# Patient Record
Sex: Female | Born: 1935 | Race: White | Hispanic: No | Marital: Single
Health system: Southern US, Community
[De-identification: ages and names within clinical notes are randomized; demographics above are authoritative.]

## PROBLEM LIST (undated history)

## (undated) DIAGNOSIS — F0390 Unspecified dementia without behavioral disturbance: Secondary | ICD-10-CM

## (undated) DIAGNOSIS — I10 Essential (primary) hypertension: Secondary | ICD-10-CM

## (undated) DIAGNOSIS — Z950 Presence of cardiac pacemaker: Secondary | ICD-10-CM

## (undated) DIAGNOSIS — I2510 Atherosclerotic heart disease of native coronary artery without angina pectoris: Secondary | ICD-10-CM

## (undated) HISTORY — DX: Presence of cardiac pacemaker: Z95.0

## (undated) HISTORY — DX: Atherosclerotic heart disease of native coronary artery without angina pectoris: I25.10

---

## 2016-08-29 ENCOUNTER — Emergency Department: Payer: Medicare Other

## 2016-08-29 ENCOUNTER — Inpatient Hospital Stay (HOSPITAL_COMMUNITY): Payer: Medicare Other

## 2016-08-29 ENCOUNTER — Inpatient Hospital Stay (HOSPITAL_COMMUNITY)
Admission: AD | Admit: 2016-08-29 | Discharge: 2016-09-05 | DRG: 064 | Disposition: A | Discharge status: Skilled Nursing Facility | Payer: Medicare Other | Source: Other Acute Inpatient Hospital | Attending: Neurology | Admitting: Neurology

## 2016-08-29 ENCOUNTER — Emergency Department
Admission: EM | Admit: 2016-08-29 | Discharge: 2016-08-29 | Payer: Medicare Other | Attending: Student in an Organized Health Care Education/Training Program | Admitting: Student in an Organized Health Care Education/Training Program

## 2016-08-29 DIAGNOSIS — G9340 Encephalopathy, unspecified: Secondary | ICD-10-CM | POA: Diagnosis not present

## 2016-08-29 DIAGNOSIS — I619 Nontraumatic intracerebral hemorrhage, unspecified: Secondary | ICD-10-CM | POA: Diagnosis present

## 2016-08-29 DIAGNOSIS — A419 Sepsis, unspecified organism: Secondary | ICD-10-CM | POA: Diagnosis not present

## 2016-08-29 DIAGNOSIS — D72829 Elevated white blood cell count, unspecified: Secondary | ICD-10-CM | POA: Diagnosis present

## 2016-08-29 DIAGNOSIS — F0281 Dementia in other diseases classified elsewhere with behavioral disturbance: Secondary | ICD-10-CM | POA: Diagnosis not present

## 2016-08-29 DIAGNOSIS — I2510 Atherosclerotic heart disease of native coronary artery without angina pectoris: Secondary | ICD-10-CM | POA: Diagnosis not present

## 2016-08-29 DIAGNOSIS — I10 Essential (primary) hypertension: Secondary | ICD-10-CM | POA: Diagnosis present

## 2016-08-29 DIAGNOSIS — Z79899 Other long term (current) drug therapy: Secondary | ICD-10-CM | POA: Diagnosis not present

## 2016-08-29 DIAGNOSIS — R5383 Other fatigue: Secondary | ICD-10-CM | POA: Diagnosis present

## 2016-08-29 DIAGNOSIS — E854 Organ-limited amyloidosis: Secondary | ICD-10-CM | POA: Diagnosis not present

## 2016-08-29 DIAGNOSIS — I611 Nontraumatic intracerebral hemorrhage in hemisphere, cortical: Principal | ICD-10-CM | POA: Diagnosis present

## 2016-08-29 DIAGNOSIS — G8194 Hemiplegia, unspecified affecting left nondominant side: Secondary | ICD-10-CM

## 2016-08-29 DIAGNOSIS — Z7982 Long term (current) use of aspirin: Secondary | ICD-10-CM | POA: Diagnosis not present

## 2016-08-29 DIAGNOSIS — Z66 Do not resuscitate: Secondary | ICD-10-CM | POA: Diagnosis not present

## 2016-08-29 DIAGNOSIS — H53462 Homonymous bilateral field defects, left side: Secondary | ICD-10-CM | POA: Diagnosis not present

## 2016-08-29 DIAGNOSIS — E876 Hypokalemia: Secondary | ICD-10-CM | POA: Diagnosis not present

## 2016-08-29 DIAGNOSIS — Z993 Dependence on wheelchair: Secondary | ICD-10-CM | POA: Diagnosis not present

## 2016-08-29 DIAGNOSIS — I680 Cerebral amyloid angiopathy: Secondary | ICD-10-CM | POA: Diagnosis present

## 2016-08-29 DIAGNOSIS — F0390 Unspecified dementia without behavioral disturbance: Secondary | ICD-10-CM | POA: Diagnosis not present

## 2016-08-29 DIAGNOSIS — R40241 Glasgow coma scale score 13-15, unspecified time: Secondary | ICD-10-CM | POA: Diagnosis present

## 2016-08-29 DIAGNOSIS — G936 Cerebral edema: Secondary | ICD-10-CM | POA: Diagnosis present

## 2016-08-29 DIAGNOSIS — Z950 Presence of cardiac pacemaker: Secondary | ICD-10-CM | POA: Diagnosis not present

## 2016-08-29 DIAGNOSIS — E785 Hyperlipidemia, unspecified: Secondary | ICD-10-CM | POA: Diagnosis not present

## 2016-08-29 DIAGNOSIS — R4182 Altered mental status, unspecified: Secondary | ICD-10-CM | POA: Diagnosis not present

## 2016-08-29 DIAGNOSIS — I6789 Other cerebrovascular disease: Secondary | ICD-10-CM | POA: Diagnosis not present

## 2016-08-29 DIAGNOSIS — R531 Weakness: Secondary | ICD-10-CM

## 2016-08-29 DIAGNOSIS — G301 Alzheimer's disease with late onset: Secondary | ICD-10-CM | POA: Diagnosis not present

## 2016-08-29 HISTORY — DX: Essential (primary) hypertension: I10

## 2016-08-29 HISTORY — DX: Unspecified dementia, unspecified severity, without behavioral disturbance, psychotic disturbance, mood disturbance, and anxiety: F03.90

## 2016-08-29 LAB — TROPONIN I: Troponin I: 0.05 ng/mL (ref <0.03)

## 2016-08-29 LAB — URINALYSIS, COMPLETE (UACMP) WITH MICROSCOPIC
Bacteria, UA: NONE SEEN
Bilirubin Urine: NEGATIVE
Glucose, UA: NEGATIVE mg/dL
KETONES UR: 20 mg/dL — AB
Leukocytes, UA: NEGATIVE
Nitrite: NEGATIVE
PROTEIN: NEGATIVE mg/dL
Specific Gravity, Urine: 1.021 (ref 1.005–1.030)
pH: 6 (ref 5.0–8.0)

## 2016-08-29 LAB — COMPREHENSIVE METABOLIC PANEL
ALBUMIN: 3.4 g/dL — AB (ref 3.5–5.0)
ALT: 17 U/L (ref 14–54)
AST: 29 U/L (ref 15–41)
Alkaline Phosphatase: 81 U/L (ref 38–126)
Anion gap: 9 (ref 5–15)
BILIRUBIN TOTAL: 1.2 mg/dL (ref 0.3–1.2)
BUN: 24 mg/dL — ABNORMAL HIGH (ref 6–20)
CHLORIDE: 102 mmol/L (ref 101–111)
CO2: 27 mmol/L (ref 22–32)
Calcium: 9.2 mg/dL (ref 8.9–10.3)
Creatinine, Ser: 0.8 mg/dL (ref 0.44–1.00)
GFR calc Af Amer: >60 mL/min (ref >60)
GFR calc non Af Amer: >60 mL/min (ref >60)
GLUCOSE: 160 mg/dL — AB (ref 65–99)
Potassium: 3.5 mmol/L (ref 3.5–5.1)
Sodium: 138 mmol/L (ref 135–145)
TOTAL PROTEIN: 6.7 g/dL (ref 6.5–8.1)

## 2016-08-29 LAB — CBC WITH DIFFERENTIAL/PLATELET
BASOS PCT: 0 %
Basophils Absolute: 0.1 10*3/uL (ref 0–0.1)
Eosinophils Absolute: 0 10*3/uL (ref 0–0.7)
Eosinophils Relative: 0 %
HEMATOCRIT: 43.9 % (ref 35.0–47.0)
Hemoglobin: 14.6 g/dL (ref 12.0–16.0)
Lymphocytes Relative: 9 %
Lymphs Abs: 1.3 10*3/uL (ref 1.0–3.6)
MCH: 29.5 pg (ref 26.0–34.0)
MCHC: 33.3 g/dL (ref 32.0–36.0)
MCV: 88.5 fL (ref 80.0–100.0)
Monocytes Absolute: 1.5 10*3/uL — ABNORMAL HIGH (ref 0.2–0.9)
Monocytes Relative: 10 %
NEUTROS ABS: 12.3 10*3/uL — AB (ref 1.4–6.5)
NEUTROS PCT: 81 %
Platelets: 244 10*3/uL (ref 150–440)
RBC: 4.96 MIL/uL (ref 3.80–5.20)
RDW: 13.8 % (ref 11.5–14.5)
WBC: 15.1 10*3/uL — AB (ref 3.6–11.0)

## 2016-08-29 LAB — LACTIC ACID, PLASMA: LACTIC ACID, VENOUS: 1.8 mmol/L (ref 0.5–1.9)

## 2016-08-29 LAB — MRSA PCR SCREENING: MRSA BY PCR: NEGATIVE

## 2016-08-29 LAB — PROTIME-INR
INR: 1.13
Prothrombin Time: 14.6 seconds (ref 11.4–15.2)

## 2016-08-29 LAB — APTT: APTT: 28 s (ref 24–36)

## 2016-08-29 MED ORDER — SODIUM CHLORIDE 0.9 % IV BOLUS (SEPSIS)
1000.0000 mL | Freq: Once | INTRAVENOUS | Status: AC
  Administered 2016-08-29: 1000 mL via INTRAVENOUS

## 2016-08-29 MED ORDER — ACETAMINOPHEN 160 MG/5ML PO SOLN: 650.0000 mg | ORAL | Status: DC | PRN

## 2016-08-29 MED ORDER — SODIUM CHLORIDE 0.9 % IV SOLN
INTRAVENOUS | Status: DC
  Administered 2016-08-29 – 2016-09-02 (×6): via INTRAVENOUS

## 2016-08-29 MED ORDER — DEXTROSE 5 % IV SOLN
2.0000 g | INTRAVENOUS | Status: AC
  Administered 2016-08-29: 2 g via INTRAVENOUS
  Filled 2016-08-29: qty 2

## 2016-08-29 MED ORDER — ACETAMINOPHEN 325 MG PO TABS
650.0000 mg | ORAL_TABLET | ORAL | Status: DC | PRN
  Administered 2016-08-31 – 2016-09-01 (×3): 650 mg via ORAL
  Filled 2016-08-29 (×3): qty 2

## 2016-08-29 MED ORDER — CHLORHEXIDINE GLUCONATE 0.12 % MT SOLN
15.0000 mL | Freq: Two times a day (BID) | OROMUCOSAL | Status: DC
  Administered 2016-08-29 – 2016-08-30 (×3): 15 mL via OROMUCOSAL
  Filled 2016-08-29 (×2): qty 15

## 2016-08-29 MED ORDER — MORPHINE SULFATE (PF) 2 MG/ML IV SOLN: 1.0000 mg | INTRAVENOUS | Status: DC | PRN

## 2016-08-29 MED ORDER — NICARDIPINE HCL IN NACL 20-0.86 MG/200ML-% IV SOLN
0.0000 mg/h | INTRAVENOUS | Status: DC
  Filled 2016-08-29: qty 200

## 2016-08-29 MED ORDER — SODIUM CHLORIDE 0.9 % IV SOLN
20.0000 ug | Freq: Once | INTRAVENOUS | Status: AC
  Administered 2016-08-29: 20 ug via INTRAVENOUS
  Filled 2016-08-29: qty 5

## 2016-08-29 MED ORDER — SENNOSIDES-DOCUSATE SODIUM 8.6-50 MG PO TABS
1.0000 | ORAL_TABLET | Freq: Two times a day (BID) | ORAL | Status: DC
  Administered 2016-08-30 – 2016-09-03 (×9): 1 via ORAL
  Filled 2016-08-29 (×10): qty 1

## 2016-08-29 MED ORDER — ACETAMINOPHEN 650 MG RE SUPP
650.0000 mg | RECTAL | Status: DC | PRN
  Administered 2016-08-29 – 2016-08-31 (×2): 650 mg via RECTAL
  Filled 2016-08-29 (×2): qty 1

## 2016-08-29 MED ORDER — STROKE: EARLY STAGES OF RECOVERY BOOK
Freq: Once | Status: AC
  Administered 2016-08-29: 21:00:00
  Filled 2016-08-29 (×2): qty 1

## 2016-08-29 MED ORDER — ORAL CARE MOUTH RINSE
15.0000 mL | Freq: Two times a day (BID) | OROMUCOSAL | Status: DC
  Administered 2016-08-30: 15 mL via OROMUCOSAL

## 2016-08-29 MED ORDER — CLEVIDIPINE BUTYRATE 0.5 MG/ML IV EMUL
0.0000 mg/h | INTRAVENOUS | Status: DC
  Administered 2016-08-30: 3 mg/h via INTRAVENOUS
  Administered 2016-08-30: 1 mg/h via INTRAVENOUS
  Administered 2016-08-30: 4 mg/h via INTRAVENOUS
  Administered 2016-08-30: 8 mg/h via INTRAVENOUS
  Administered 2016-08-31: 5 mg/h via INTRAVENOUS
  Administered 2016-08-31: 4 mg/h via INTRAVENOUS
  Administered 2016-08-31: 5 mg/h via INTRAVENOUS
  Administered 2016-08-31: 7 mg/h via INTRAVENOUS
  Administered 2016-08-31: 8 mg/h via INTRAVENOUS
  Administered 2016-09-01: 7 mg/h via INTRAVENOUS
  Administered 2016-09-01: 6 mg/h via INTRAVENOUS
  Administered 2016-09-01: 2 mg/h via INTRAVENOUS
  Administered 2016-09-02: 5 mg/h via INTRAVENOUS
  Administered 2016-09-02: 4 mg/h via INTRAVENOUS
  Filled 2016-08-29 (×14): qty 50

## 2016-08-29 MED ORDER — PANTOPRAZOLE SODIUM 40 MG IV SOLR
40.0000 mg | Freq: Every day | INTRAVENOUS | Status: DC
  Administered 2016-08-29 – 2016-08-30 (×2): 40 mg via INTRAVENOUS
  Filled 2016-08-29 (×2): qty 40

## 2016-08-29 MED ORDER — VANCOMYCIN HCL IN DEXTROSE 1-5 GM/200ML-% IV SOLN
1000.0000 mg | Freq: Once | INTRAVENOUS | Status: AC
  Administered 2016-08-29: 1000 mg via INTRAVENOUS
  Filled 2016-08-29: qty 200

## 2016-08-29 MED ORDER — NICARDIPINE HCL IN NACL 20-0.86 MG/200ML-% IV SOLN
0.0000 mg/h | INTRAVENOUS | Status: DC
Start: 2016-08-29 — End: 2016-08-29
  Administered 2016-08-29: 5 mg/h via INTRAVENOUS
  Administered 2016-08-29 (×2): 7 mg/h via INTRAVENOUS
  Filled 2016-08-29 (×3): qty 200

## 2016-08-29 NOTE — ED Provider Notes (Signed)
[facility] Emergency Department Provider Note    First MD Initiated Contact with Patient 08/29/16 0915     (approximate)  I have reviewed the triage vital signs and the nursing notes.   HISTORY  Chief Complaint Weakness  Level V Caveat:  dementia HPI Evelyn Figueroa is a 82 y.o. female history dementia presents from [place] memory care unit which he complaint of weakness and being found slumped over in her wheelchair this morning. Did have a low-grade fever this morning was given Tylenol. No other information provided. Restart retail and was unable to reach any providers. Patient very drowsy and lethargic. Will open eyes voice. Does appear confused.   Past Medical History:  Diagnosis Date  . Dementia   . Hypertension    No family history on file. History reviewed. No pertinent surgical history. There are no active problems to display for this patient.     Prior to Admission medications   Medication Sig Start Date End Date Taking? Authorizing Provider  acetaminophen (TYLENOL) 500 MG tablet Take 1,000 mg by mouth every 8 (eight) hours as needed for mild pain or moderate pain.   Yes Historical Provider, MD  aspirin EC 81 MG tablet Take 81 mg by mouth daily.   Yes Historical Provider, MD  atorvastatin (LIPITOR) 20 MG tablet Take 20 mg by mouth at bedtime.   Yes Historical Provider, MD  calcium carbonate (TUMS - DOSED IN MG ELEMENTAL CALCIUM) 500 MG chewable tablet Chew 1 tablet by mouth every 3 (three) hours as needed for indigestion or heartburn.   Yes Historical Provider, MD  divalproex (DEPAKOTE) 250 MG DR tablet Take 250 mg by mouth 2 (two) times daily.   Yes Historical Provider, MD  donepezil (ARICEPT) 10 MG tablet Take 10 mg by mouth daily.   Yes Historical Provider, MD  enalapril (VASOTEC) 10 MG tablet Take 10 mg by mouth 2 (two) times daily.   Yes Historical Provider, MD  famotidine (PEPCID) 20 MG tablet Take 20 mg by mouth 2 (two) times daily.    Yes Historical Provider, MD  metoprolol tartrate (LOPRESSOR) 25 MG tablet Take 12.5 mg by mouth 2 (two) times daily.   Yes Historical Provider, MD  Multiple Vitamins-Minerals (MULTIVITAMIN ADULT PO) Take 1 tablet by mouth daily.   Yes Historical Provider, MD  polyethylene glycol (MIRALAX / GLYCOLAX) packet Take 17 g by mouth daily.   Yes Historical Provider, MD    Allergies Augmentin [amoxicillin-pot clavulanate] and Iodine    Social History Social History  Substance Use Topics  . Smoking status: Unknown If Ever Smoked  . Smokeless tobacco: Not on file  . Alcohol use No    Review of Systems Patient denies headaches, rhinorrhea, blurry vision, numbness, shortness of breath, chest pain, edema, cough, abdominal pain, nausea, vomiting, diarrhea, dysuria, fevers, rashes or hallucinations unless otherwise stated above in HPI. ____________________________________________   PHYSICAL EXAM:  VITAL SIGNS: Vitals:   08/29/16 1254 08/29/16 1300  BP: 140/63 (!) 145/55  Pulse: 72 65  Resp: 18 14  Temp:      Constitutional: ill and frail appearing  Eyes: Conjunctivae are normal. PERRL. EOMI. Head: Atraumatic. Nose: No congestion/rhinnorhea. Mouth/Throat: Mucous membranes are moist.  Oropharynx non-erythematous. Neck: No stridor. Painless ROM. No cervical spine tenderness to palpation Hematological/Lymphatic/Immunilogical: No cervical lymphadenopathy. Cardiovascular: Normal rate, regular rhythm. Grossly normal heart sounds.  Good peripheral circulation. Respiratory: Normal respiratory effort.  No retractions. Lungs with coarse bibasilar breathsounds Gastrointestinal: Soft and nontender. No distention. No  abdominal bruits. No CVA tenderness. Musculoskeletal: No lower extremity tenderness nor edema.  No joint effusions. Neurologic:  GcS 13, left sided weakness, no seziure activity, confused, full neuro exam limited due to dementia Skin:  Skin is warm, dry and intact. No rash  noted.   ____________________________________________   LABS (all labs ordered are listed, but only abnormal results are displayed)  Results for orders placed or performed during the hospital encounter of 08/29/16 (from the past 24 hour(s))  CBC with Differential/Platelet     Status: Abnormal   Collection Time: 08/29/16  9:26 AM  Result Value Ref Range   WBC 15.1 (H) 3.6 - 11.0 K/uL   RBC 4.96 3.80 - 5.20 MIL/uL   Hemoglobin 14.6 12.0 - 16.0 g/dL   HCT 60.4 54.0 - 98.1 %   MCV 88.5 80.0 - 100.0 fL   MCH 29.5 26.0 - 34.0 pg   MCHC 33.3 32.0 - 36.0 g/dL   RDW 19.1 47.8 - 29.5 %   Platelets 244 150 - 440 K/uL   Neutrophils Relative % 81 %   Neutro Abs 12.3 (H) 1.4 - 6.5 K/uL   Lymphocytes Relative 9 %   Lymphs Abs 1.3 1.0 - 3.6 K/uL   Monocytes Relative 10 %   Monocytes Absolute 1.5 (H) 0.2 - 0.9 K/uL   Eosinophils Relative 0 %   Eosinophils Absolute 0.0 0 - 0.7 K/uL   Basophils Relative 0 %   Basophils Absolute 0.1 0 - 0.1 K/uL  Comprehensive metabolic panel     Status: Abnormal   Collection Time: 08/29/16  9:26 AM  Result Value Ref Range   Sodium 138 135 - 145 mmol/L   Potassium 3.5 3.5 - 5.1 mmol/L   Chloride 102 101 - 111 mmol/L   CO2 27 22 - 32 mmol/L   Glucose, Bld 160 (H) 65 - 99 mg/dL   BUN 24 (H) 6 - 20 mg/dL   Creatinine, Ser 6.21 0.44 - 1.00 mg/dL   Calcium 9.2 8.9 - 30.8 mg/dL   Total Protein 6.7 6.5 - 8.1 g/dL   Albumin 3.4 (L) 3.5 - 5.0 g/dL   AST 29 15 - 41 U/L   ALT 17 14 - 54 U/L   Alkaline Phosphatase 81 38 - 126 U/L   Total Bilirubin 1.2 0.3 - 1.2 mg/dL   GFR calc non Af Amer >60 >60 mL/min   GFR calc Af Amer >60 >60 mL/min   Anion gap 9 5 - 15  Urinalysis, Complete w Microscopic     Status: Abnormal   Collection Time: 08/29/16  9:26 AM  Result Value Ref Range   Color, Urine YELLOW (A) YELLOW   APPearance CLEAR (A) CLEAR   Specific Gravity, Urine 1.021 1.005 - 1.030   pH 6.0 5.0 - 8.0   Glucose, UA NEGATIVE NEGATIVE mg/dL   Hgb urine dipstick  MODERATE (A) NEGATIVE   Bilirubin Urine NEGATIVE NEGATIVE   Ketones, ur 20 (A) NEGATIVE mg/dL   Protein, ur NEGATIVE NEGATIVE mg/dL   Nitrite NEGATIVE NEGATIVE   Leukocytes, UA NEGATIVE NEGATIVE   RBC / HPF 0-5 0 - 5 RBC/hpf   WBC, UA 0-5 0 - 5 WBC/hpf   Bacteria, UA NONE SEEN NONE SEEN   Squamous Epithelial / LPF 0-5 (A) NONE SEEN   Mucous PRESENT   Troponin I     Status: Abnormal   Collection Time: 08/29/16  9:26 AM  Result Value Ref Range   Troponin I 0.05 (HH) <0.03 ng/mL  Lactic  acid, plasma     Status: None   Collection Time: 08/29/16  9:52 AM  Result Value Ref Range   Lactic Acid, Venous 1.8 0.5 - 1.9 mmol/L   ____________________________________________  EKG My review and personal interpretation at Time: 09:32   Indication: ams  Rate: 60  Rhythm: a paced Axis: left Other: rbbb, no STEMI ____________________________________________  RADIOLOGY  I personally reviewed all radiographic images ordered to evaluate for the above acute complaints and reviewed radiology reports and findings.  These findings were personally discussed with the patient.  Please see medical record for radiology report.  ____________________________________________   PROCEDURES  Procedure(s) performed:  Procedures    Critical Care performed: yes CRITICAL CARE Performed by: Willy Eddy   Total critical care time: 50 minutes  Critical care time was exclusive of separately billable procedures and treating other patients.  Critical care was necessary to treat or prevent imminent or life-threatening deterioration.  Critical care was time spent personally by me on the following activities: development of treatment plan with patient and/or surrogate as well as nursing, discussions with consultants, evaluation of patient's response to treatment, examination of patient, obtaining history from patient or surrogate, ordering and performing treatments and interventions, ordering and review of  laboratory studies, ordering and review of radiographic studies, pulse oximetry and re-evaluation of patient's condition.  ____________________________________________   INITIAL IMPRESSION / ASSESSMENT AND PLAN / ED COURSE  Pertinent labs & imaging results that were available during my care of the patient were reviewed by me and considered in my medical decision making (see chart for details).  DDX: Dehydration, sepsis, pna, uti, hypoglycemia, cva, drug effect,  Evelyn Figueroa is a 82 y.o. who presents to the ED with Concern for UTI given weakness and found slumped over in wheelchair at nursing facility. No reported trauma. Patient does take aspirin. She arrives febrile but otherwise hemodynamically stable.  Patient is confused with history of dementia so uncertain as to whether this is a her baseline. Given her age and history of CVA remains on the differential but also infectious process or metabolic process.  The patient will be placed on continuous pulse oximetry and telemetry for monitoring.  Laboratory evaluation will be sent to evaluate for the above complaints.     Clinical Course as of Aug 30 1318  Fri Aug 29, 2016  1115 Patient was CT imaging suggestive of acute hemorrhagic stroke. Patient still protecting her airway.  States she does have a headache. Patient receiving IV antibiotics for sepsis, but I do believe fever and leukocytosis is likely secondary to stroke. Was able to reach the nursing home who confirms that the patient was just bumped over in her chair and weak starting 2 days ago.  [PR]  1147 I spoke with Carma Lair, the patient's daughter and medical decision maker, who states the patient would never have wanted to be placed on life-support or have any invasive heroic lifesaving interventions. She will focus on quality of life would be agreeable to any sort of IV medications and management short of life-support they could help extend her quality of life.  [PR]  1207   Associate with Dr. Roxy Manns neurology emesis And agrees to accept patient to the neuro ICU for further evaluation and management. Agreed with current management. Agreed with holding Keppra unless patient demonstrates seizure-like activity. Cardene is available as needed for elevated blood pressure but her hemodynamics remain stable at this time. We'll continue to monitor. [PR]    Clinical Course User Index [  PR] Willy EddyPatrick Norwin Aleman, MD     ____________________________________________   FINAL CLINICAL IMPRESSION(S) / ED DIAGNOSES  Final diagnoses:  Hemorrhagic stroke (HCC)  Weakness  Sepsis, due to unspecified organism [facility](HCC)      NEW MEDICATIONS STARTED DURING THIS VISIT:  New Prescriptions   No medications on file     Note:  This document was prepared using Dragon voice recognition software and may include unintentional dictation errors.    Willy EddyPatrick Lilliam Chamblee, MD 08/29/16 1322

## 2016-08-29 NOTE — ED Notes (Signed)
Will hang Cardene as needed per MD. Bp stable at this time.

## 2016-08-29 NOTE — ED Notes (Signed)
MD Roxan HockeyRobinson talked to family to update on pts status.

## 2016-08-29 NOTE — Progress Notes (Signed)
Attempted to call the patient's emergency contact (son Lawanna KobusMike Pautz) at the number listed in her chart [phone]). No answer, went to automated voicemail with generic greeting that did not identify the owner of the voicemail. No message was left as could not confirm identity of recipient. Will need to try again at a later time.

## 2016-08-29 NOTE — ED Triage Notes (Signed)
Pt came to ED via EMS from [facility]. Pt is in memory care unit. Staff noticed pt has been weak past 2 days, found pt slumped over in wheel chair, leaning to right side. Had low grade fever this morning, given 500mg  tylenol. Temperature 99.4 on arrival.

## 2016-08-29 NOTE — H&P (Signed)
Neurology Admission H&P   CC: "I'm cold"  HPI: This is a 81-yo woman with h/o dementia who is admitted in transfer from [facility] for management of an acute ICH. History is obtained from the review of the medical record as well as from d/w transferring ED MD. The patient is unable to provide any meaningful information at this time.   She has a history of dementia, the extent of which is not clear to me at this time. She is a resident at [facility] in the Memory Care Unit. Per ED notes, staff reported that the patient had seemed weak for the past two days. This morning she was found slumped over in her wheelchair, leaning to her right side. They reported that she had a low-grade fever this morning for which she was given 500 mg of Tylenol. She was sent to the ED at [facility] for evaluation. Per ED MD notes, the patient was "very drowsy and lethargic. Will open eyes to voice. Does appear confused." She had an initial GCS of 13 with left sided weakness. Due to initial concerns for possible UTI, she was given vancomycin 1000 mg and cefepime 2 grams. CTH was obtained and showed a large R parietal lobe hemorrhage with intraventricular and subarachnoid extension. She has required intermittent treatment for SBP > 160. ED MD reports that after d/w family they requested that the patient be DNR but that she would want all other interventions short of cardiopulmonary resuscitation.  On arrival to the [facility] Neuro ICU, she remains drowsy but rouses to voice and answers simple questions. She has a left hemiparesis. Encounter is limited by encephalopathy and likely by underlying dementia as well.    PMH: Limited to chart review. Patient is unable to provide, no family present.  Past Medical History:  Diagnosis Date  . Dementia   . Hypertension     PSH: Limited to chart review. Patient is unable to provide, no family present.  No past surgical history on  file.  Family history: Limited to chart review. Patient is unable to provide, no family present.  No family history on file.  Social history: Limited to chart review. Patient is unable to provide, no family present.  Social History   Social History  . Marital status: Single    Spouse name: N/A  . Number of children: N/A  . Years of education: N/A   Occupational History  . Not on file.   Social History Main Topics  . Smoking status: Unknown If Ever Smoked  . Smokeless tobacco: Not on file  . Alcohol use No  . Drug use: Unknown  . Sexual activity: Not on file   Other Topics Concern  . Not on file   Social History Narrative  . No narrative on file    Current outpatient meds: Medications reviewed and reconciled. Limited to chart review. Patient is unable to provide, no family present.  1. Miralax 17 g daily 2. MVI once daily 3. Metoprolol 12.5 mg BID 4. Famotidine 20 mg BID 5. Enalapril 10 mg BID 6. Donepezil 10 mg daily 7. Divalproex 25o mg BID 8. Atorvastatin 20 mg QHS 9. Aspirin 81 mg daily 10 . Tylenol prn   Allergies: Allergies  Allergen Reactions  . Augmentin [Amoxicillin-Pot Clavulanate]   . Iodine     ROS: As per HPI. A full 14-point review of systems was performed and is otherwise notable for being cold. ROS is likely limited by encephalopathy and underlying  dementia.   PE:  BP 126/90   Pulse 60   Temp 97.7 F (36.5 C) (Oral)   Resp 16   SpO2 99%   General: WDWN, lying in ICU bed, no acute distress. She is drowsy and initially asleep but rouses to voice. She will briefly open her eyes and fix on me but quickly closes them again even with persistent stimulation. She will follow some simple commands like wiggling toes and sticking out tongue but this is variable. There is a paucity of spontaneous speech. She is shivering.  HEENT: Normocephalic. Neck supple without LAD. I am unable to visualize her oropharynx as she does not open her mouth wide enough.  Tongue appears moist. Sclerae anicteric. No conjunctival injection.  CV: Regular, no murmur. Carotid pulses full and symmetric, no bruits. Distal pulses 2+ and symmetric.  Lungs: CTAB on anterior exam.  Abdomen: Soft, non-distended, non-tender. Bowel sounds present x4.  Extremities: No C/C/E. Neuro:  CN: Pupils are equal and round. They are symmetrically reactive from 2-->1 mm. Eyes are conjugate when she is at her most alert, dysconjugate when drowsy/asleep. She does not consistently blink to visual threat but this is limited by drowsiness. Corneals are intact, weaker on the L side. She has a mildly asymmetric grimace on the L. Tongue protrudes to midline with weak effort. The remainder of her cranial nerves cannot be assessed as she does not participate with the exam.   Motor: Normal bulk for age. She has increased tone in the L arm and leg. She has paratonia on the R. She moves the R side spontaneously with 5/5 grip. She does not participate with confrontational strength testing. She has intermittent shivering.  Sensation: She grimaces and withdraws to noxious stimuli x4, with less response in the LUE.  DTRs: 2+ on the R, 3+ on the L. Toes downgoing bilaterally. No pathologic reflexes.  Coordination and gait: Deferred as she does not participate with the exam.   Labs:  Lab Results  Component Value Date   WBC 15.1 (H) 08/29/2016   HGB 14.6 08/29/2016   HCT 43.9 08/29/2016   PLT 244 08/29/2016   GLUCOSE 160 (H) 08/29/2016   ALT 17 08/29/2016   AST 29 08/29/2016   NA 138 08/29/2016   K 3.5 08/29/2016   CL 102 08/29/2016   CREATININE 0.80 08/29/2016   BUN 24 (H) 08/29/2016   CO2 27 08/29/2016   Lactate 1.8 Troponin 0.05 UA: moderate Hgb, ketones 20  Imaging:  [facility] and [facility]. This shows a large intraparenchymal hemorrhage involving the R parietal and posterior R temporal lobes. This was measured at 5.4 x 6.4 x 2.0 cm in  size with estimated volume of 34.6 mL. There is a mild degree of surrounding vasogenic edema with mild local mass effect. There is subarachnoid blood overlying the hemorrhage and over the adjacent R parietal lobe with a small amount of blood also noted in the interpeduncular fossa. The hemorrhage extends into the dependent portions of both lateral ventricles. There is moderate diffuse generalized atrophy. Diffuse ventriculomegaly is present which may be more reflective of her underlying dementia rather than an acute obstructive picture. Moderate small vessel ischemic disease is seen in the bihemispheric white matter. An old lacune is noted in the R basal ganglia.   Assessment and Plan:  1. Acute ICH: This is most likely hypertensive in etiology though must also consider amyloid angiopathy given lobar location and underlying dementia. Other potential  considerations include AVM, aneurysm, trauma, sympathomimetic abuse, and complication from anticoagulation or antiplatelet therapy. Coags were note done in ED, will order now. Urine drug screen not done in ED, will order now. Monitor blood pressure, goal SBP 140-160 mmHg. Nicardipine drip ordered for blood pressure control. Avoid antiplatelet agents and NSAIDs for the 1-2 weeks. Avoid therapeutic systemic anticoagulation for the next 3-4 weeks. Repeat CTH at 2100 tonight. Use SCDs for DVT prophylaxis. Avoid fever and hyperglycemia as these are associated with worse neurologic prognosis. Avoid hypotonic IVF to minimize the risk of exacerbating cerebral edema. Frequent neuro checks. Prognosis is poor.   ICH score: 2  2. Left hemiparesis: This is acute, due to ICH. PT/OT/rehab as appropriate depending upon patient course and goals of care.  3. Acute encephalopathy: This is likely due in large part to her large ICH in the setting of dementia. Her dementia places her at increased risk of encephalopathy from all causes and predicts delayed recovery from same. She is  likely to have a new baseline should we survive this event. Labs thus far are unrevealing; optimize metabolic status as needed. Minimize the use of opiates, benzos or any medication with strong anticholinergic properties as much as possible.   4. Dementia: This is chronic. The severity is not clear at this time but ED MD states that family reported she has had a more rapid decline in her level of function over the past few months. Will need to try to get a better sense of her baseline level of function.   The patient would likely benefit from acute rehab services. Recommend consultation of PT/OT/SLP with consideration for PM&R consult as appropriate depending upon clinical progress and therapists' recommendations.   Fall risk: Risk factors for falls include age, need for assistance with ADLs at baseline, use of psychotropic/sedative/hypnotic medications, polypharmacy, baseline cognitive dysfunction, female gender, impaired mobility/gait, and new left hemiparesis. Patient's encephalopathy and dementia preclude education at this time. Strict fall precautions. Limit psychoactive medications and sedating medications.  Delirium risk: Risk factors for delirium include history of dementia/cognitive impairment, age, acute ICH, medications/polypharmacy, impaired ADLs at baseline, and ICU admission. Optimize metabolic status. Treat any infection aggressively. Minimize the use of opiates, benzos or any medication with strong anticholinergic properties as much as possible. Optimize sleep-wake cycles as much as you can by keeping the room bright with activity during the day and dark and quiet at night.   Needs outpatient neurology follow-up?: To be determined.   No family present at the time of my visit. Will try to reach them by phone at number listed in chart.   This patient is critically ill and at significant risk of neurological worsening, death and care requires constant monitoring of vital signs,  hemodynamics,respiratory and cardiac monitoring, neurological assessment, discussion with family, other specialists and medical decision making of high complexity. A total of 70 minutes of critical care time was spent on this case.

## 2016-08-29 NOTE — Progress Notes (Signed)
Order for an MRI, however patient has a pacemaker and radiology has no information about the pacemaker. Unable to complete MRI until further information about pacemaker retrieved from family.  Evelyn Figueroa, [name], RN 10:39 PM

## 2016-08-30 ENCOUNTER — Inpatient Hospital Stay (HOSPITAL_COMMUNITY): Payer: Medicare Other

## 2016-08-30 ENCOUNTER — Encounter (HOSPITAL_COMMUNITY): Payer: Self-pay | Admitting: *Deleted

## 2016-08-30 DIAGNOSIS — Z950 Presence of cardiac pacemaker: Secondary | ICD-10-CM

## 2016-08-30 DIAGNOSIS — D72829 Elevated white blood cell count, unspecified: Secondary | ICD-10-CM

## 2016-08-30 DIAGNOSIS — F0281 Dementia in other diseases classified elsewhere with behavioral disturbance: Secondary | ICD-10-CM

## 2016-08-30 DIAGNOSIS — I10 Essential (primary) hypertension: Secondary | ICD-10-CM

## 2016-08-30 DIAGNOSIS — E785 Hyperlipidemia, unspecified: Secondary | ICD-10-CM

## 2016-08-30 DIAGNOSIS — G301 Alzheimer's disease with late onset: Secondary | ICD-10-CM

## 2016-08-30 LAB — CBC
HCT: 38.3 % (ref 36.0–46.0)
Hemoglobin: 12.8 g/dL (ref 12.0–15.0)
MCH: 29.8 pg (ref 26.0–34.0)
MCHC: 33.4 g/dL (ref 30.0–36.0)
MCV: 89.1 fL (ref 78.0–100.0)
PLATELETS: 207 10*3/uL (ref 150–400)
RBC: 4.3 MIL/uL (ref 3.87–5.11)
RDW: 13.4 % (ref 11.5–15.5)
WBC: 12.3 10*3/uL — AB (ref 4.0–10.5)

## 2016-08-30 LAB — COMPREHENSIVE METABOLIC PANEL
ALT: 15 U/L (ref 14–54)
AST: 23 U/L (ref 15–41)
Albumin: 3 g/dL — ABNORMAL LOW (ref 3.5–5.0)
Alkaline Phosphatase: 66 U/L (ref 38–126)
Anion gap: 11 (ref 5–15)
BUN: 18 mg/dL (ref 6–20)
CHLORIDE: 101 mmol/L (ref 101–111)
CO2: 26 mmol/L (ref 22–32)
CREATININE: 0.68 mg/dL (ref 0.44–1.00)
Calcium: 8.9 mg/dL (ref 8.9–10.3)
GFR calc Af Amer: >60 mL/min (ref >60)
GLUCOSE: 99 mg/dL (ref 65–99)
Potassium: 3 mmol/L — ABNORMAL LOW (ref 3.5–5.1)
Sodium: 138 mmol/L (ref 135–145)
Total Bilirubin: 1.3 mg/dL — ABNORMAL HIGH (ref 0.3–1.2)
Total Protein: 5.6 g/dL — ABNORMAL LOW (ref 6.5–8.1)

## 2016-08-30 LAB — LIPID PANEL
CHOL/HDL RATIO: 3.3 ratio
Cholesterol: 148 mg/dL (ref 0–200)
HDL: 45 mg/dL (ref >40)
LDL CALC: 89 mg/dL (ref 0–99)
Triglycerides: 70 mg/dL (ref <150)
VLDL: 14 mg/dL (ref 0–40)

## 2016-08-30 LAB — VITAMIN B12: Vitamin B-12: 321 pg/mL (ref 180–914)

## 2016-08-30 LAB — TSH: TSH: 0.536 u[IU]/mL (ref 0.350–4.500)

## 2016-08-30 MED ORDER — SODIUM CHLORIDE 0.9 % IV SOLN
30.0000 meq | Freq: Once | INTRAVENOUS | Status: AC
  Administered 2016-08-30: 30 meq via INTRAVENOUS
  Filled 2016-08-30: qty 15

## 2016-08-30 NOTE — Evaluation (Signed)
Physical Therapy Evaluation Patient Details Name: Donald Jacque MRN: [id number] DOB: 05/30/1935 Today's Date: 08/30/2016   History of Present Illness  Pt is a 82 y.o. female with h/o dementia and HTN. Pt presenting with L sided weakness and lethargy. CT on 3/9 showed a large intraparenchymal hematoma within the R posterior temporal and parietal lobes.  Clinical Impression  Patient demonstrates deficits in functional mobility as indicated below. Will need continued skilled PT to address deficits and maximize function. Will trial acute PT. Recommend return to SNF upon acute discharge.    Follow Up Recommendations SNF;Supervision/Assistance - 24 hour    Equipment Recommendations  None recommended by PT    Recommendations for Other Services       Precautions / Restrictions Precautions Precautions: Fall Restrictions Weight Bearing Restrictions: No      Mobility  Bed Mobility Overal bed mobility: Needs Assistance Bed Mobility: Supine to Sit;Sit to Supine     Supine to sit: Max assist;+2 for physical assistance Sit to supine: Max assist;+2 for physical assistance   General bed mobility comments: Assist for LEs and trunk with use of bed pad and helicopter technique. Pt appears fearful of falling, tight grip on bed rail and posterior lean resistive to movement.  Transfers                 General transfer comment: Not assessed  Ambulation/Gait                Stairs            Wheelchair Mobility    Modified Rankin (Stroke Patients Only)       Balance Overall balance assessment: Needs assistance Sitting-balance support: Feet supported;Bilateral upper extremity supported Sitting balance-Leahy Scale: Poor Sitting balance - Comments: Posterior lean in sitting, resistive to anterior lean even with cues and encouragement Postural control: Posterior lean                                   Pertinent Vitals/Pain Pain Assessment: Faces Faces  Pain Scale: Hurts little more Pain Location: stomach Pain Intervention(s): Monitored during session    Home Living Family/patient expects to be discharged to:: Skilled nursing facility                 Additional Comments: [facility]    Prior Function           Comments: Unsure, pt unable to provide information.     Hand Dominance        Extremity/Trunk Assessment   Upper Extremity Assessment LUE Deficits / Details: Does have active movement and grip. Not moving LUE to command. No withdrawl to noxious stimuli LUE Sensation: decreased light touch LUE Coordination: decreased fine motor;decreased gross motor    Lower Extremity Assessment Lower Extremity Assessment: Generalized weakness;RLE deficits/detail;LLE deficits/detail;Difficult to assess due to impaired cognition RLE Deficits / Details: noted AROM bilateral LEs but unable to fully complete range against gravity RLE Coordination: decreased fine motor;decreased gross motor LLE Deficits / Details: noted AROM bilateral LEs but unable to fully complete range against gravity LLE Coordination: decreased fine motor;decreased gross motor    Cervical / Trunk Assessment Cervical / Trunk Assessment: Kyphotic  Communication   Communication: No difficulties (hx of dementia)  Cognition Arousal/Alertness: Lethargic Behavior During Therapy: Flat affect Overall Cognitive Status: No family/caregiver present to determine baseline cognitive functioning  General Comments: Hx of dementia. Pt inconsistently responding appropriately to commands.    General Comments      Exercises     Assessment/Plan    PT Assessment Patient needs continued PT services  PT Problem List Decreased strength;Decreased activity tolerance;Decreased balance;Decreased mobility;Decreased coordination;Decreased cognition;Decreased safety awareness;Decreased knowledge of precautions;Pain       PT Treatment  Interventions DME instruction;Functional mobility training;Therapeutic activities;Therapeutic exercise;Balance training;Cognitive remediation;Patient/family education    PT Goals (Current goals can be found in the Care Plan section)  Acute Rehab PT Goals Patient Stated Goal: none stated PT Goal Formulation: Patient unable to participate in goal setting Time For Goal Achievement: 09/13/16 Potential to Achieve Goals: Poor    Frequency Min 2X/week   Barriers to discharge        Co-evaluation PT/OT/SLP Co-Evaluation/Treatment: Yes Reason for Co-Treatment: Necessary to address cognition/behavior during functional activity PT goals addressed during session: Mobility/safety with mobility         End of Session Equipment Utilized During Treatment: Oxygen Activity Tolerance: Patient limited by fatigue Patient left: in bed;with call bell/phone within reach;with bed alarm set Nurse Communication: Mobility status PT Visit Diagnosis: Other symptoms and signs involving the nervous system (R29.898)         Time: [id number] PT Time Calculation (min) (ACUTE ONLY): 17 min   Charges:   PT Evaluation $PT Eval Moderate Complexity: 1 Procedure     PT G Codes:         Fabio AsaDevon J Sharronda Schweers 08/30/2016, 4:16 PM Charlotte Crumbevon Norvel Wenker, PT DPT  [phone]

## 2016-08-30 NOTE — Progress Notes (Signed)
Patient is unable to have MRI.  She has a pacemaker implanted.  The [employer] generator is MRI conditional but one of the leads is by [employer] and is not compatible.  Please call MRI with any questions.

## 2016-08-30 NOTE — Evaluation (Signed)
Occupational Therapy Evaluation Patient Details Name: Evelyn Figueroa MRN: [id number] DOB: 10-01-1934 Today's Date: 08/30/2016    History of Present Illness Pt is a 82 y.o. female with h/o dementia and HTN. Pt presenting with L sided weakness and lethargy. CT on 3/9 showed a large intraparenchymal hematoma within the R posterior temporal and parietal lobes.   Clinical Impression   Unsure of pts PLOF but she did reside at [facility]. Currently pt max-total assist for ADL and bed mobility. Pt inconsistently responding to one step commands and appropriately answering questions. Pt presenting with L sided visual deficits, decreased ROM in LUE, impaired cognition, and poor sitting balance impacting her independence and safety with ADL and functional mobility. Recommending SNF for follow up to maximize independence and safety with ADL and functional mobility. Pt would benefit from continued skilled OT to address established goals.    Follow Up Recommendations  SNF;Supervision/Assistance - 24 hour    Equipment Recommendations  Other (comment) (TBD)    Recommendations for Other Services       Precautions / Restrictions Precautions Precautions: Fall Restrictions Weight Bearing Restrictions: No      Mobility Bed Mobility Overal bed mobility: Needs Assistance Bed Mobility: Supine to Sit;Sit to Supine     Supine to sit: Max assist;+2 for physical assistance Sit to supine: Max assist;+2 for physical assistance   General bed mobility comments: Assist for LEs and trunk with use of bed pad and helicopter technique. Pt appears fearful of falling, tight grip on bed rail and posterior lean resistive to movement.  Transfers                 General transfer comment: Not assessed    Balance Overall balance assessment: Needs assistance Sitting-balance support: Feet supported;Bilateral upper extremity supported Sitting balance-Leahy Scale: Poor Sitting balance -  Comments: Posterior lean in sitting, resistive to anterior lean even with cues and encouragement Postural control: Posterior lean                                  ADL Overall ADL's : Needs assistance/impaired     Grooming: Maximal assistance;Sitting;Wash/dry face Grooming Details (indicate cue type and reason): hand over hand for washcloth to face                   Toilet Transfer Details (indicate cue type and reason): Max assist to roll for placement of bed pan           General ADL Comments: Overall pt max-total assist for ADL. Pt intermittently following commands with R side of body>L side     Vision   Vision Assessment?: Vision impaired- to be further tested in functional context Additional Comments: Difficult to assess due to impaired cognition. Pt does have R gaze preference but can cross midline and attend to stimulus on L side with cues but unable to sustain.     Perception     Praxis      Pertinent Vitals/Pain Pain Assessment: Faces Faces Pain Scale: Hurts little more Pain Location: stomach Pain Intervention(s): Monitored during session     Hand Dominance     Extremity/Trunk Assessment Upper Extremity Assessment Upper Extremity Assessment: LUE deficits/detail;Difficult to assess due to impaired cognition LUE Deficits / Details: Does have active movement and grip. Not moving LUE to command. No withdrawl to noxious stimuli LUE Sensation: decreased light touch LUE Coordination: decreased fine motor;decreased gross  motor   Lower Extremity Assessment Lower Extremity Assessment: Defer to PT evaluation   Cervical / Trunk Assessment Cervical / Trunk Assessment: Kyphotic   Communication Communication Communication: No difficulties (hx of dementia)   Cognition Arousal/Alertness: Lethargic Behavior During Therapy: Flat affect Overall Cognitive Status: No family/caregiver present to determine baseline cognitive functioning                  General Comments: Hx of dementia. Pt inconsistently responding appropriately to commands.   General Comments       Exercises       Shoulder Instructions      Home Living Family/patient expects to be discharged to:: Skilled nursing facility                                 Additional Comments: [facility]      Prior Functioning/Environment          Comments: Unsure, pt unable to provide information.        OT Problem List: Decreased strength;Decreased range of motion;Impaired balance (sitting and/or standing);Impaired vision/perception;Decreased coordination;Decreased cognition;Decreased safety awareness;Decreased knowledge of use of DME or AE;Impaired sensation;Impaired tone;Impaired UE functional use;Pain      OT Treatment/Interventions: Self-care/ADL training;Therapeutic exercise;Neuromuscular education;Energy conservation;DME and/or AE instruction;Therapeutic activities;Visual/perceptual remediation/compensation;Patient/family education;Balance training    OT Goals(Current goals can be found in the care plan section) Acute Rehab OT Goals Patient Stated Goal: none stated OT Goal Formulation: With patient Time For Goal Achievement: 09/13/16 Potential to Achieve Goals: Fair ADL Goals Pt Will Perform Grooming: with min assist;sitting Pt Will Transfer to Toilet: with min assist;with +2 assist;stand pivot transfer;bedside commode Additional ADL Goal #1: Pt will follow one step command 75% of the time in a minimally distracting environment. Additional ADL Goal #2: Pt will attend to 3/5 items on L side of visual field with min verbal cues.  OT Frequency: Min 2X/week   Barriers to D/C:            Co-evaluation PT/OT/SLP Co-Evaluation/Treatment: Yes Reason for Co-Treatment: Necessary to address cognition/behavior during functional activity;Complexity of the patient's impairments (multi-system involvement);For patient/therapist safety    OT goals addressed during session: Other (comment) (functional mobility)      End of Session Nurse Communication: Mobility status (RN present during session)  Activity Tolerance: Patient tolerated treatment well Patient left: in bed;with call bell/phone within reach;with nursing/sitter in room  OT Visit Diagnosis: Hemiplegia and hemiparesis Hemiplegia - Right/Left: Left Hemiplegia - caused by: Nontraumatic SAH                ADL either performed or assessed with clinical judgement  Time: 2841-32440830-0846 OT Time Calculation (min): 16 min Charges:  OT General Charges $OT Visit: 1 Procedure OT Evaluation $OT Eval Moderate Complexity: 1 Procedure G-Codes:     Vinnie Bobst A. Brett Albinooffey, M.S., OTR/L Pager: [phone]  Gaye AlkenBailey A Johnie Stadel 08/30/2016, 9:31 AM

## 2016-08-30 NOTE — Evaluation (Signed)
Clinical/Bedside Swallow Evaluation Patient Details  Name: Evelyn Figueroa MRN: [id number] Date of Birth: 1935/04/26  Today's Date: 08/30/2016 Time: SLP Start Time (ACUTE ONLY): 0954 SLP Stop Time (ACUTE ONLY): 1024 SLP Time Calculation (min) (ACUTE ONLY): 30 min  Past Medical History:  Past Medical History:  Diagnosis Date  . Dementia   . Hypertension    Past Surgical History: No past surgical history on file. HPI:  Pt is a 82 y.o. female with h/o dementia and HTN. Pt presenting with L sided weakness and lethargy. 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 Pt with intermittent tremors of face and oral musculature which nursing states has been baseline since admission. Pt unable to follow commands consistently for OME. Pt required cueing for improved attention secondary to lethargy. Intermittent anterior spillage at midline evidenced with cup sips in addition to prolonged oral transit with intermittent oral holding of liquid consistencies. Overt s/sx of suspected reduced airway protection evidenced following thin and nectar thick liquid trials includling coughing and throat clearing. Swallow initiation suspected to be delayed through palpation. Honey thick liquids with intermittent wet vocal quality. Puree consistencies without overt s/sx of aspiration. Recommend MBS to objectively evaluate swallow function and honey thick liquids with puree consistencies with medicines crushed in puree until MBS can be completed. ST to continue to monitor.  SLP Visit Diagnosis: Dysphagia, unspecified (R13.10)    Aspiration Risk  Moderate aspiration  risk    Diet Recommendation   Honey thick liquids, puree with plans for MBS   Medication Administration: Crushed with puree    Other  Recommendations Oral Care Recommendations: Oral care BID;Staff/trained caregiver to provide oral care Other Recommendations: Order thickener from pharmacy   Follow up Recommendations Skilled Nursing facility      Frequency and Duration min 2x/week  1 week       Prognosis Prognosis for Safe Diet Advancement: Fair Barriers to Reach Goals: Severity of deficits;Time post onset;Cognitive deficits      Swallow Study   General Date of Onset: 08/29/16 HPI: Pt is a 82 y.o. female with h/o dementia and HTN. Pt presenting with L sided weakness and lethargy. CT on 3/9 showed a large intraparenchymal hematoma within the R posterior temporal and parietal lobes Type of Study: Bedside Swallow Evaluation Previous Swallow Assessment: none on file Diet Prior to this Study: NPO Temperature Spikes Noted: No Respiratory Status: Room air History of Recent Intubation: No Behavior/Cognition: Lethargic/Drowsy;Distractible;Requires cueing Oral Cavity Assessment: Dry Oral Care Completed by SLP: Yes Oral Cavity - Dentition: Missing dentition Vision: Impaired for self-feeding Self-Feeding Abilities: Needs assist Patient Positioning: Upright in bed Baseline Vocal Quality: Low vocal intensity Volitional Cough: Weak Volitional Swallow: Unable to elicit    Oral/Motor/Sensory Function Overall Oral Motor/Sensory Function: Generalized oral weakness   Ice Chips Ice chips: Impaired Presentation: Spoon Oral Phase Impairments: Reduced lingual movement/coordination;Impaired mastication Oral Phase Functional Implications: Prolonged oral transit;Oral holding Pharyngeal Phase Impairments: Suspected delayed Swallow;Multiple swallows;Cough - Delayed   Thin Liquid Thin Liquid: Impaired Presentation: Spoon Oral Phase Impairments: Reduced lingual movement/coordination Oral Phase  Functional Implications: Prolonged oral transit;Oral holding Pharyngeal  Phase Impairments: Suspected delayed Swallow;Multiple swallows;Cough - Delayed;Cough - Immediate    Nectar Thick Nectar Thick Liquid: Impaired  Presentation: Cup;Spoon Oral Phase Impairments: Reduced lingual movement/coordination Oral phase functional implications: Prolonged oral transit;Oral holding Pharyngeal Phase Impairments: Suspected delayed Swallow;Multiple swallows;Cough - Delayed;Cough - Immediate   Honey Thick Honey Thick Liquid: Impaired Presentation: Spoon;Cup Oral Phase Impairments: Reduced lingual movement/coordination Oral Phase Functional Implications: Prolonged oral transit;Oral holding Pharyngeal Phase Impairments: Suspected delayed Swallow;Multiple swallows;Wet Vocal Quality   Puree Puree: Impaired Presentation: Spoon Oral Phase Impairments: Reduced lingual movement/coordination;Impaired mastication Oral Phase Functional Implications: Prolonged oral transit;Oral holding Pharyngeal Phase Impairments: Multiple swallows;Suspected delayed Swallow   Solid   GO   Solid: Impaired Oral Phase Impairments: Reduced lingual movement/coordination;Impaired mastication Oral Phase Functional Implications: Prolonged oral transit;Impaired mastication;Oral residue Pharyngeal Phase Impairments: Multiple swallows;Suspected delayed Swallow       Marcene Duos MA, CCC-SLP Acute Care Speech Language Pathologist    Marcene Duos E 08/30/2016,10:52 AM

## 2016-08-30 NOTE — Progress Notes (Signed)
STROKE TEAM PROGRESS NOTE   HISTORY OF PRESENT ILLNESS (per record) This is a 82-yo woman with h/o dementia who is admitted in transfer from [facility] for management of an acute ICH. History is obtained from the review of the medical record as well as from d/w transferring ED MD. The patient is unable to provide any meaningful information at this time.   She has a history of dementia, the extent of which is not clear to me at this time. She is a resident at [facility] in the Memory Care Unit. Per ED notes, staff reported that the patient had seemed weak for the past two days. This morning she was found slumped over in her wheelchair, leaning to her right side. They reported that she had a low-grade fever this morning for which she was given 500 mg of Tylenol. She was sent to the ED at [facility] for evaluation. Per ED MD notes, the patient was "very drowsy and lethargic. Will open eyes to voice. Does appear confused." She had an initial GCS of 13 with left sided weakness. Due to initial concerns for possible UTI, she was given vancomycin 1000 mg and cefepime 2 grams. CTH was obtained and showed a large R parietal lobe hemorrhage with intraventricular and subarachnoid extension. She has required intermittent treatment for SBP > 160. ED MD reports that after d/w family they requested that the patient be DNR but that she would want all other interventions short of cardiopulmonary resuscitation.  On arrival to the [facility] Neuro ICU, she remains drowsy but rouses to voice and answers simple questions. She has a left hemiparesis. Encounter is limited by encephalopathy and likely by underlying dementia as well.    SUBJECTIVE (INTERVAL HISTORY) Her RN is at the bedside.  She is still drowsy sleepy but able to arouse slightly open eyes. Repeat CT in am showed stable hematoma. She has pacemaker, not able to do MRI. Lives in memory unit due to dementia.     OBJECTIVE Temp:  [97.7 F (36.5 C)-101.3 F (38.5 C)] 99.5 F (37.5 C) (03/10 0351) Pulse Rate:  [37-123] 66 (03/10 0700) Cardiac Rhythm: Normal sinus rhythm;Heart block (03/10 0400) Resp:  [11-27] 12 (03/10 0700) BP: (117-178)/(55-94) 128/57 (03/10 0700) SpO2:  [91 %-100 %] 100 % (03/10 0700) Weight:  [57.6 kg (126 lb 15.8 oz)-60.5 kg (133 lb 6.1 oz)] 57.6 kg (126 lb 15.8 oz) (03/09 2000)  CBC:  Recent Labs Lab 08/29/16 0926 08/30/16 0346  WBC 15.1* 12.3*  NEUTROABS 12.3*  --   HGB 14.6 12.8  HCT 43.9 38.3  MCV 88.5 89.1  PLT 244 207    Basic Metabolic Panel:  Recent Labs Lab 08/29/16 0926 08/30/16 0346  NA 138 138  K 3.5 3.0*  CL 102 101  CO2 27 26  GLUCOSE 160* 99  BUN 24* 18  CREATININE 0.80 0.68  CALCIUM 9.2 8.9    Lipid Panel:    Component Value Date/Time   CHOL 148 08/30/2016 0346   TRIG 70 08/30/2016 0346   HDL 45 08/30/2016 0346   CHOLHDL 3.3 08/30/2016 0346   VLDL 14 08/30/2016 0346   LDLCALC 89 08/30/2016 0346   HgbA1c: No results found for: HGBA1C Urine Drug Screen: No results found for: LABOPIA, COCAINSCRNUR, LABBENZ, AMPHETMU, THCU, LABBARB    IMAGING I have personally reviewed the radiological images below and agree with the radiology interpretations.  Ct Head Wo Contrast 08/29/2016 1. No significant interval change in size of  a large intraparenchymal hematoma within the right posterior temporal and parietal lobes with surrounding edema. Bilateral intraventricular hemorrhages are again noted. There is subarachnoid hemorrhage overlying the right intraparenchymal hematoma which does not appear significantly changed. A small amount of left parietal subarachnoid hemorrhage is also unchanged.  2. The ventricles are enlarged but stable in size compared to prior CT. No significant midline shift.  3. Atrophy and periventricular small vessel ischemic changes.   Ct Head Wo Contrast 08/29/2016 1. Large acute hematoma involving the right parietal  and temporal lobes as described (estimated volume 34.6 ml). This likely represents a hemorrhagic stroke. There is associated subarachnoid and intraventricular hemorrhage.  2. Mild ventricular dilatation is likely related to atrophy. No midline shift.   Dg Chest Portable 1 View 08/29/2016 Left pacer is in place with leads in the right atrium and right ventricle.  No active disease.   TTE pending   PHYSICAL EXAM  Temp:  [97.7 F (36.5 C)-101.3 F (38.5 C)] 98.3 F (36.8 C) (03/10 0800) Pulse Rate:  [37-155] 155 (03/10 0900) Resp:  [11-27] 26 (03/10 0900) BP: (117-163)/(55-94) 138/74 (03/10 0900) SpO2:  [92 %-100 %] 92 % (03/10 0900) Weight:  [126 lb 15.8 oz (57.6 kg)] 126 lb 15.8 oz (57.6 kg) (03/09 2000)  General - Well nourished, well developed, drowsy sleepy.  Ophthalmologic - Fundi not visualized due to noncooperation.  Cardiovascular - Regular rate and rhythm.  Neuro - drowsy sleepy, arousable with brief eye opening, orientated to name only, not answer questions consistently. Able to repeat simple sentences, not cooperative on naming. Able to have simple sentence, but not following commands well. Right gaze preference, barely cross midline, left neglect. No significant facial asymmetry, not follow commands for tongue protrusion. BUE 3/5, RLE 3-/5 on pain and LLE slight withdraw on pain. However, significantly increase muscle tone at LUE and LLE. BUE postural tremor with cog wheeling. DTR 1+ and no babinski. Sensation, coordination and gait not tested.    ASSESSMENT/PLAN Ms. Chizaram Latino is a 82 y.o. female with history of dementia, permanent pacemaker, and hypertension presenting with AMS and weakness. She did not receive IV t-PA due to hemorrhage.  Stroke: moderate to large ICH within the right posterior temporal and parietal lobes ~ 34.6 ml, etiology unclear, HTN vs. CAA.   Resultant  Right gaze preference, left neglect, drowsy  MRI / MRA - PPM manufacturer unknown  CT  head - right posterior temporal and parietal ICH  Repeat CT head - stable hematoma  Carotid Doppler - not indicated  2D Echo - pending  LDL - 89  HgbA1c - pending  VTE prophylaxis - SCDs  Diet NPO time specified  aspirin 81 mg daily prior to admission, now on No antithrombotic  Ongoing aggressive stroke risk factor management  Therapy recommendations: SNF recommended  Disposition: Pending  Dementia  Memory unit NH resident  Question about CAA as the cause of lobar hemorrhage  MRI not able to be done due to pacer  Hypertension  Stable - on Cleviprex  NPO status  Pt wishes said no NG tube  Wean off cleviprex as able  Long-term BP goal normotensive  Hyperlipidemia  Home meds:  Lipitor 20 mg daily not resumed in hospital  LDL 89, goal < 70  resume statin at discharge  Other Stroke Risk Factors  Advanced age  Other Active Problems  Mild leukocytosis - 15.1 -> 12.3 (Tmax 101.3)  Hypokalemia - 3.0 - supplement - recheck  Pacemaker in situ  Limited DNR -  all other interventions short of cardiopulmonary resuscitation.  Hospital day # 1  This patient is critically ill due to large lobar ICH, dementia, HTN and at significant risk of neurological worsening, death form recurrent ICH, cerebral edema, delirium, seizure. This patient's care requires constant monitoring of vital signs, hemodynamics, respiratory and cardiac monitoring, review of multiple databases, neurological assessment, discussion with family, other specialists and medical decision making of high complexity. I spent 40 minutes of neurocritical care time in the care of this patient.  Marvel PlanJindong Hance Caspers, MD PhD Stroke Neurology 08/30/2016 11:27 AM   To contact Stroke Continuity provider, please refer to [email or web address]. After hours, contact General Neurology

## 2016-08-31 ENCOUNTER — Inpatient Hospital Stay (HOSPITAL_COMMUNITY): Payer: Medicare Other

## 2016-08-31 ENCOUNTER — Encounter (HOSPITAL_COMMUNITY): Payer: Self-pay | Admitting: *Deleted

## 2016-08-31 DIAGNOSIS — I6789 Other cerebrovascular disease: Secondary | ICD-10-CM

## 2016-08-31 LAB — COMPREHENSIVE METABOLIC PANEL
ALBUMIN: 3 g/dL — AB (ref 3.5–5.0)
ALK PHOS: 65 U/L (ref 38–126)
ALT: 17 U/L (ref 14–54)
AST: 23 U/L (ref 15–41)
Anion gap: 10 (ref 5–15)
BILIRUBIN TOTAL: 1.3 mg/dL — AB (ref 0.3–1.2)
BUN: 15 mg/dL (ref 6–20)
CALCIUM: 9.2 mg/dL (ref 8.9–10.3)
CO2: 25 mmol/L (ref 22–32)
Chloride: 102 mmol/L (ref 101–111)
Creatinine, Ser: 0.67 mg/dL (ref 0.44–1.00)
GFR calc non Af Amer: >60 mL/min (ref >60)
GLUCOSE: 107 mg/dL — AB (ref 65–99)
POTASSIUM: 3.3 mmol/L — AB (ref 3.5–5.1)
SODIUM: 137 mmol/L (ref 135–145)
TOTAL PROTEIN: 5.9 g/dL — AB (ref 6.5–8.1)

## 2016-08-31 LAB — HEMOGLOBIN A1C
Hgb A1c MFr Bld: 5.6 % (ref 4.8–5.6)
Mean Plasma Glucose: 114 mg/dL

## 2016-08-31 LAB — CBC
HCT: 41 % (ref 36.0–46.0)
HEMOGLOBIN: 13.7 g/dL (ref 12.0–15.0)
MCH: 29.4 pg (ref 26.0–34.0)
MCHC: 33.4 g/dL (ref 30.0–36.0)
MCV: 88 fL (ref 78.0–100.0)
Platelets: 241 10*3/uL (ref 150–400)
RBC: 4.66 MIL/uL (ref 3.87–5.11)
RDW: 13 % (ref 11.5–15.5)
WBC: 12.9 10*3/uL — ABNORMAL HIGH (ref 4.0–10.5)

## 2016-08-31 LAB — ECHOCARDIOGRAM COMPLETE
Height: 64 in
WEIGHTICAEL: 2031.76 [oz_av]

## 2016-08-31 MED ORDER — METOPROLOL TARTRATE 25 MG PO TABS
25.0000 mg | ORAL_TABLET | Freq: Two times a day (BID) | ORAL | Status: DC
  Administered 2016-09-01: 25 mg via ORAL
  Filled 2016-08-31: qty 1

## 2016-08-31 MED ORDER — ORAL CARE MOUTH RINSE
15.0000 mL | Freq: Two times a day (BID) | OROMUCOSAL | Status: DC
  Administered 2016-08-31 – 2016-09-05 (×6): 15 mL via OROMUCOSAL

## 2016-08-31 MED ORDER — DONEPEZIL HCL 10 MG PO TABS
10.0000 mg | ORAL_TABLET | Freq: Every day | ORAL | Status: DC
  Administered 2016-08-31 – 2016-09-05 (×6): 10 mg via ORAL
  Filled 2016-08-31 (×6): qty 1

## 2016-08-31 MED ORDER — FAMOTIDINE 20 MG PO TABS
20.0000 mg | ORAL_TABLET | Freq: Every day | ORAL | Status: DC
  Administered 2016-08-31 – 2016-09-05 (×6): 20 mg via ORAL
  Filled 2016-08-31 (×6): qty 1

## 2016-08-31 MED ORDER — FAMOTIDINE 20 MG PO TABS: 20.0000 mg | ORAL_TABLET | Freq: Two times a day (BID) | ORAL | Status: DC

## 2016-08-31 MED ORDER — DIVALPROEX SODIUM 250 MG PO DR TAB
250.0000 mg | DELAYED_RELEASE_TABLET | Freq: Two times a day (BID) | ORAL | Status: DC
  Administered 2016-08-31 – 2016-09-05 (×11): 250 mg via ORAL
  Filled 2016-08-31 (×11): qty 1

## 2016-08-31 MED ORDER — METOPROLOL TARTRATE 25 MG PO TABS
12.5000 mg | ORAL_TABLET | Freq: Once | ORAL | Status: AC
  Administered 2016-08-31: 12.5 mg via ORAL
  Filled 2016-08-31: qty 1

## 2016-08-31 MED ORDER — METOPROLOL TARTRATE 25 MG PO TABS
12.5000 mg | ORAL_TABLET | Freq: Two times a day (BID) | ORAL | Status: DC
  Administered 2016-08-31 (×2): 12.5 mg via ORAL
  Filled 2016-08-31 (×2): qty 1

## 2016-08-31 MED ORDER — ENALAPRIL MALEATE 10 MG PO TABS
10.0000 mg | ORAL_TABLET | Freq: Two times a day (BID) | ORAL | Status: DC
  Administered 2016-08-31 (×2): 10 mg via ORAL
  Filled 2016-08-31 (×3): qty 1

## 2016-08-31 NOTE — Progress Notes (Signed)
Modified Barium Swallow Progress Note  Patient Details  Name: Evelyn Figueroa MRN: [id number] Date of Birth: 03-04-1935  Today's Date: 08/31/2016  Modified Barium Swallow completed.  Full report located under Chart Review in the Imaging Section.  Brief recommendations include the following:  Clinical Impression  Patient presents with moderate oral and mild pharyngeal dysphagia in the setting of large intraparenchymal hematoma within the R posterior temporal and parietal lobeswith prior history of dementia.  Patient is alert but confused, with reduced ability to follow verbal commands. Deficits noted are primarily sensory and oral; reduced awarenes of bolus. Patient with moderate aspiration x2 of thin liquids with large straw bolus and following mechanical solid with oral residue. Oral phase noted for bolus holding, lingual pumping, delayed oral transit. Observed reduced oral manipulation, bolus cohesion of mechanical soft solids, leading to base of tongue and palatal residue. This appears to decrease sensory perception for subsequent thin liquid wash, resulting in prolonged oral holding, delayed swallow initiation with moderate aspiration during the swallow. Swallow initiation is primarily timely at the base of tongue/valleculae with bolus delivery via teaspoon. Mildly reduced base of tongue retraction with mild BOT residue of pureed solids. Epiglottic deflection complete, airway protection adequate. With teaspoons of thin liquid, some intermittent, mild premature spillage to pyriform sinuses due to oral holding/sensory deficits, however most of bolus retained in valleculae prior to swallow initation, with adequate airway protection. In general, oral holding is reduced with smaller volumes and with sensory feedback from teaspoon. Patient benefits from verbal cues and occasional tactile cuing with dry spoon for swallow initiation. With straw and cup sips, patient has poor awareness of bolus and takes in  large volume, resulting in prolonged oral holding, significant premature spillage to pyriform sinuses which results in sensed aspiration during and of pyriform residue after the swallow. Given oral deficits, recommend dys 1 diet with thin liquids via teaspoon only with full supervision and cuing to reduce oral holding. Check oral cavity for residue and cue with dry spoon for additional swallow. Monitor for fatigue, level of alertness. SLP will f/u for tolerance, caregiver education, training in compensatory techniques and feeding strategies, and repeat instrumental as needed.   Swallow Evaluation Recommendations       SLP Diet Recommendations: Thin liquid;Dysphagia 1 (Puree) solids   Liquid Administration via: Spoon;No straw   Medication Administration: Crushed with puree   Supervision: Full assist for feeding   Compensations: Slow rate;Small sips/bites;Other (Comment) (dry spoon to cue swallow)   Postural Changes: Seated upright at 90 degrees   Oral Care Recommendations: Oral care BID     Rondel BatonMary Beth Sebastiana Wuest, [place] CF-SLP Speech-Language Pathologist [phone]    Arlana LindauMary E Dorris Vangorder 08/31/2016,11:11 AM

## 2016-08-31 NOTE — Progress Notes (Signed)
*  PRELIMINARY RESULTS* Echocardiogram 2D Echocardiogram has been performed.  Jeryl Columbialliott, Jadriel Saxer 08/31/2016, 11:18 AM

## 2016-08-31 NOTE — Progress Notes (Signed)
  Speech Language Pathology Treatment: Dysphagia  Patient Details Name: Evelyn Figueroa MRN: [id number] DOB: [birth year] Today's Date: 08/31/2016 Time: 1000-1015 SLP Time Calculation (min) (ACUTE ONLY): 15 min  Assessment / Plan / Recommendation Clinical Impression  Patient seen for dysphagia treatment. RN and daughter present in room; provided education regarding results of MBS and swallowing precautions, demonstrated compensatory techniques and feeding strategies including dry teaspoon and verbal cue for swallow initiation, monitoring for oral holding. Observed RN student feeding patient utilizing the recommended techniques; patient tolerates without overt signs of aspiration and min oral holding. Recommend dys 1 with thin liquids via teaspoon only, medications crushed in puree. SLP will f/u for tolerance, use of strategies, upgrade/repeat instrumental as appropriate.     HPI HPI: Pt is a 82 y.o. female with h/o dementia and HTN. Pt presenting with L sided weakness and lethargy. CT on 3/9 showed a large intraparenchymal hematoma within the R posterior temporal and parietal lobes      SLP Plan  Continue with current plan of care       Recommendations  Diet recommendations: Dysphagia 1 (puree);Thin liquid Liquids provided via: Teaspoon;No straw Medication Administration: Crushed with puree Supervision: Trained caregiver to feed patient Compensations: Slow rate;Small sips/bites;Other (Comment) (dry spoon, verbal cue for swallow initiation) Postural Changes and/or Swallow Maneuvers: Seated upright 90 degrees                Oral Care Recommendations: Staff/trained caregiver to provide oral care;Oral care BID Follow up Recommendations: Skilled Nursing facility SLP Visit Diagnosis: Dysphagia, oropharyngeal phase (R13.12) Plan: Continue with current plan of care       GO               [name], MS CF-SLP Speech-Language Pathologist [phone]  Arlana LindauMary E Dany Figueroa 08/31/2016,  11:15 AM

## 2016-08-31 NOTE — Progress Notes (Signed)
STROKE TEAM PROGRESS NOTE   SUBJECTIVE (INTERVAL HISTORY) Her daughter is at the bedside.  She is more awake alert, passed swallow last night. Repeat CT yesterday no change. Daughter confimed pt DNR status but still want aggressive care at this time. Will consider further code status if pt declines.     OBJECTIVE Temp:  [98.8 F (37.1 C)-99.7 F (37.6 C)] 99.6 F (37.6 C) (03/11 1149) Pulse Rate:  [25-105] 82 (03/11 1200) Cardiac Rhythm: Normal sinus rhythm;Heart block (03/11 0000) Resp:  [12-33] 18 (03/11 1200) BP: (83-168)/(54-130) 151/75 (03/11 1200) SpO2:  [93 %-100 %] 100 % (03/11 1200)  CBC:   Recent Labs Lab 08/29/16 0926 08/30/16 0346 08/31/16 0448  WBC 15.1* 12.3* 12.9*  NEUTROABS 12.3*  --   --   HGB 14.6 12.8 13.7  HCT 43.9 38.3 41.0  MCV 88.5 89.1 88.0  PLT 244 207 241    Basic Metabolic Panel:   Recent Labs Lab 08/30/16 0346 08/31/16 0448  NA 138 137  K 3.0* 3.3*  CL 101 102  CO2 26 25  GLUCOSE 99 107*  BUN 18 15  CREATININE 0.68 0.67  CALCIUM 8.9 9.2    Lipid Panel:     Component Value Date/Time   CHOL 148 08/30/2016 0346   TRIG 70 08/30/2016 0346   HDL 45 08/30/2016 0346   CHOLHDL 3.3 08/30/2016 0346   VLDL 14 08/30/2016 0346   LDLCALC 89 08/30/2016 0346   HgbA1c: No results found for: HGBA1C Urine Drug Screen: No results found for: LABOPIA, COCAINSCRNUR, LABBENZ, AMPHETMU, THCU, LABBARB    IMAGING I have personally reviewed the radiological images below and agree with the radiology interpretations.  Ct Head Wo Contrast 08/29/2016 1. No significant interval change in size of a large intraparenchymal hematoma within the right posterior temporal and parietal lobes with surrounding edema. Bilateral intraventricular hemorrhages are again noted. There is subarachnoid hemorrhage overlying the right intraparenchymal hematoma which does not appear significantly changed. A small amount of left parietal subarachnoid hemorrhage is also unchanged.   2. The ventricles are enlarged but stable in size compared to prior CT. No significant midline shift.  3. Atrophy and periventricular small vessel ischemic changes.   Ct Head Wo Contrast 08/29/2016 1. Large acute hematoma involving the right parietal and temporal lobes as described (estimated volume 34.6 ml). This likely represents a hemorrhagic stroke. There is associated subarachnoid and intraventricular hemorrhage.  2. Mild ventricular dilatation is likely related to atrophy. No midline shift.   Dg Chest Portable 1 View 08/29/2016 Left pacer is in place with leads in the right atrium and right ventricle.  No active disease.   Ct Head Wo Contrast 08/30/2016 IMPRESSION: 1. Expected evolution of large right parietal, temporal, and occipital lobe hemorrhage with extension into the right lateral ventricle. 2. Stable subarachnoid hemorrhage on the right. 3. Stable intraventricular hemorrhage without significant change in ventricular caliber. 4. Stable effacement of the right lateral ventricle and minimal midline shift.    TTE pending   PHYSICAL EXAM  Temp:  [98.8 F (37.1 C)-99.7 F (37.6 C)] 99.6 F (37.6 C) (03/11 1149) Pulse Rate:  [25-105] 82 (03/11 1200) Resp:  [12-33] 18 (03/11 1200) BP: (83-168)/(54-130) 151/75 (03/11 1200) SpO2:  [93 %-100 %] 100 % (03/11 1200)  General - Well nourished, well developed, not in acute distress.  Ophthalmologic - Fundi not visualized due to noncooperation.  Cardiovascular - Regular rate and rhythm.  Neuro - awake, alert, orientated to name only, not orientated to place  time or people. Able to repeat simple sentences, not able to name. Able to have simple sentence, but not following commands well. Eyes attending to both sides, no gaze deviation. However, seems to have left hemianopia. No significant facial asymmetry, not follow commands for tongue protrusion. BUE 3/5 and BLE 3-/5 but lack of effort, no lateralizing weakness. BUE postural tremor  with cog wheeling. DTR 1+ and no babinski. Sensation, coordination and gait not tested.    ASSESSMENT/PLAN Ms. Evelyn Figueroa is a 82 y.o. female with history of dementia, permanent pacemaker, and hypertension presenting with AMS and weakness. She did not receive IV t-PA due to hemorrhage.  Stroke: moderate to large ICH within the right posterior temporal and parietal lobes ~ 34.6 ml, etiology unclear, HTN vs. CAA.   Resultant left hemianopia  MRI / MRA - PPM manufacturer unknown  CT head - right posterior temporal and parietal ICH  Repeat CT head x 2 - stable hematoma  Carotid Doppler - not indicated  2D Echo - pending  LDL - 89  HgbA1c - pending  VTE prophylaxis - SCDs DIET - DYS 1 Room service appropriate? Yes; Fluid consistency: Honey Thick  aspirin 81 mg daily prior to admission, now on No antithrombotic  Ongoing aggressive stroke risk factor management  Therapy recommendations: SNF recommended  Disposition: Pending  Dementia  Memory unit NH resident  Question about CAA as the cause of lobar hemorrhage  MRI not able to be done due to pacer  Daughter confirmed pt in SNF wheelchair bound  Resume aricept  Hypertension  Stable - on Cleviprex  Resume home BP meds - enalapril, and metoprolol  Wean off cleviprex as able  Long-term BP goal normotensive  Hyperlipidemia  Home meds:  Lipitor 20 mg daily not resumed in hospital  LDL 89, goal < 70  resume statin at discharge  Other Stroke Risk Factors  Advanced age  Other Active Problems  Mild leukocytosis - 15.1 -> 12.3 -> 12.9  Hypokalemia - 3.3 - supplement - recheck  Pacemaker in situ  Limited DNR - all other interventions short of cardiopulmonary resuscitation.  Hospital day # 2  This patient is critically ill due to large lobar ICH, dementia, HTN and at significant risk of neurological worsening, death form recurrent ICH, cerebral edema, delirium, seizure. This patient's care requires  constant monitoring of vital signs, hemodynamics, respiratory and cardiac monitoring, review of multiple databases, neurological assessment, discussion with family, other specialists and medical decision making of high complexity. I spent 40 minutes of neurocritical care time in the care of this patient. I had long discussion with daughter at bedside, updated her current condition, treatment options, code status and prognosis.   Marvel PlanJindong Cartina Brousseau, MD PhD Stroke Neurology 08/31/2016 12:25 PM   To contact Stroke Continuity provider, please refer to [email or web address]. After hours, contact General Neurology

## 2016-09-01 ENCOUNTER — Inpatient Hospital Stay (HOSPITAL_COMMUNITY): Payer: Medicare Other

## 2016-09-01 DIAGNOSIS — R4182 Altered mental status, unspecified: Secondary | ICD-10-CM

## 2016-09-01 LAB — COMPREHENSIVE METABOLIC PANEL
ALT: 17 U/L (ref 14–54)
ANION GAP: 12 (ref 5–15)
AST: 21 U/L (ref 15–41)
Albumin: 2.9 g/dL — ABNORMAL LOW (ref 3.5–5.0)
Alkaline Phosphatase: 65 U/L (ref 38–126)
BILIRUBIN TOTAL: 0.8 mg/dL (ref 0.3–1.2)
BUN: 15 mg/dL (ref 6–20)
CHLORIDE: 100 mmol/L — AB (ref 101–111)
CO2: 25 mmol/L (ref 22–32)
Calcium: 9.1 mg/dL (ref 8.9–10.3)
Creatinine, Ser: 0.58 mg/dL (ref 0.44–1.00)
Glucose, Bld: 104 mg/dL — ABNORMAL HIGH (ref 65–99)
POTASSIUM: 3 mmol/L — AB (ref 3.5–5.1)
Sodium: 137 mmol/L (ref 135–145)
TOTAL PROTEIN: 5.6 g/dL — AB (ref 6.5–8.1)

## 2016-09-01 LAB — CBC
HCT: 40.3 % (ref 36.0–46.0)
Hemoglobin: 13.6 g/dL (ref 12.0–15.0)
MCH: 29.7 pg (ref 26.0–34.0)
MCHC: 33.7 g/dL (ref 30.0–36.0)
MCV: 88 fL (ref 78.0–100.0)
PLATELETS: 250 10*3/uL (ref 150–400)
RBC: 4.58 MIL/uL (ref 3.87–5.11)
RDW: 12.9 % (ref 11.5–15.5)
WBC: 13.7 10*3/uL — AB (ref 4.0–10.5)

## 2016-09-01 MED ORDER — METOPROLOL TARTRATE 50 MG PO TABS
50.0000 mg | ORAL_TABLET | Freq: Two times a day (BID) | ORAL | Status: DC
  Administered 2016-09-01 – 2016-09-05 (×8): 50 mg via ORAL
  Filled 2016-09-01 (×8): qty 1

## 2016-09-01 MED ORDER — CHLORHEXIDINE GLUCONATE 0.12 % MT SOLN
OROMUCOSAL | Status: AC
  Administered 2016-09-01: 15 mL
  Filled 2016-09-01: qty 15

## 2016-09-01 MED ORDER — POTASSIUM CHLORIDE CRYS ER 20 MEQ PO TBCR
40.0000 meq | EXTENDED_RELEASE_TABLET | ORAL | Status: AC
  Administered 2016-09-01 (×2): 40 meq via ORAL
  Filled 2016-09-01 (×2): qty 2

## 2016-09-01 MED ORDER — ENALAPRIL MALEATE 5 MG PO TABS
20.0000 mg | ORAL_TABLET | Freq: Two times a day (BID) | ORAL | Status: DC
  Administered 2016-09-01 – 2016-09-05 (×9): 20 mg via ORAL
  Filled 2016-09-01 (×5): qty 4
  Filled 2016-09-01 (×3): qty 1
  Filled 2016-09-01 (×2): qty 4

## 2016-09-01 NOTE — Progress Notes (Signed)
  Speech Language Pathology Treatment: Dysphagia  Patient Details Name: Evelyn Figueroa MRN: [id number] DOB: 07-24-1934 Today's Date: 09/01/2016 Time: [id number] SLP Time Calculation (min) (ACUTE ONLY): 10 min  Assessment / Plan / Recommendation Clinical Impression  Pt is lethargic this morning, but with Mod cues for sustained alertness she consumed thin liquids by spoon with one immediate cough observed across all trials. Intermittent oral holding was observed, which was facilitated by verbal command to swallow. Questionable small amount of pureed material observed in her mouth upon SLP arrival, but pt did appear to spontaneously clear this. Recommend to continue with Dys 1 diet and thin liquids by spoon with full supervision. Will continue to follow.   HPI HPI: Pt is a 82 y.o. female with h/o dementia and HTN. Pt presenting with L sided weakness and lethargy. CT on 3/9 showed a large intraparenchymal hematoma within the R posterior temporal and parietal lobes      SLP Plan  Continue with current plan of care       Recommendations  Diet recommendations: Dysphagia 1 (puree);Thin liquid Liquids provided via: Teaspoon Medication Administration: Crushed with puree Supervision: Full supervision/cueing for compensatory strategies;Trained caregiver to feed patient Compensations: Slow rate;Small sips/bites;Other (Comment) (cue to swallow PRN) Postural Changes and/or Swallow Maneuvers: Seated upright 90 degrees                Oral Care Recommendations: Staff/trained caregiver to provide oral care;Oral care BID Follow up Recommendations: Skilled Nursing facility SLP Visit Diagnosis: Dysphagia, oropharyngeal phase (R13.12) Plan: Continue with current plan of care       GO                Evelyn Figueroa, Evelyn Figueroa 09/01/2016, 11:56 AM  Evelyn HamLaura Figueroa, M.A. CCC-SLP [phone]

## 2016-09-01 NOTE — Evaluation (Signed)
Speech Language Pathology Evaluation Patient Details Name: Evelyn Figueroa MRN: [id number] DOB: May 16, 1935 Today's Date: 09/01/2016 Time: [id number] SLP Time Calculation (min) (ACUTE ONLY): 19 min  Problem List:  Patient Active Problem List   Diagnosis Date Noted  . ICH (intracerebral hemorrhage) (HCC) 08/29/2016   Past Medical History:  Past Medical History:  Diagnosis Date  . CAD (coronary artery disease)   . Dementia   . Hypertension   . Pacemaker    Past Surgical History: No past surgical history on file. HPI:  Pt is a 82 y.o. female with h/o dementia and HTN. Pt presenting with L sided weakness and lethargy. CT on 3/9 showed a large intraparenchymal hematoma within the R posterior temporal and parietal lobes   Assessment / Plan / Recommendation Clinical Impression  Pt has memory deficits at baseline but is typically very communicative and interactive, participating in many acitivities within her memory care unit. Today she is lethargic, requiring Mod cues to for brief periods of sustained alertness and attention. She states her own name and identifies her daughter, but does not answer additional biographical questions. She will occasionally follow one-step commands with Max cues provided. Her speech is dysarthric at the word to short phrase level due to weak phonation and imprecise articulation, seemingly without pt awareness. Pt will benefit from SLP f/u to maximize functional communication and cognition.    SLP Assessment  SLP Recommendation/Assessment: Patient needs continued Speech Lanaguage Pathology Services SLP Visit Diagnosis: Dysarthria and anarthria (R47.1);Attention and concentration deficit Attention and concentration deficit following: Nontraumatic intracerebral hemorrhage    Follow Up Recommendations  Skilled Nursing facility    Frequency and Duration min 2x/week  2 weeks      SLP Evaluation Cognition  Overall Cognitive Status: Impaired/Different from  baseline Arousal/Alertness: Lethargic Orientation Level: Oriented to person Attention: Sustained Sustained Attention: Impaired Sustained Attention Impairment: Verbal basic;Functional basic Awareness: Impaired Awareness Impairment: Emergent impairment       Comprehension  Auditory Comprehension Overall Auditory Comprehension: Impaired Yes/No Questions: Impaired Basic Biographical Questions: 76-100% accurate Complex Questions: 25-49% accurate Commands: Impaired One Step Basic Commands: 25-49% accurate Interfering Components: Other (comment);Attention;Visual impairments (lethargy)    Expression Expression Primary Mode of Expression: Verbal Verbal Expression Overall Verbal Expression: Other (comment) (difficult to assess due to severity of dysarthria)   Oral / Motor  Oral Motor/Sensory Function Overall Oral Motor/Sensory Function: Other (comment) (difficult to assess, does not follow commands consistently) Motor Speech Overall Motor Speech: Impaired Respiration: Impaired Level of Impairment: Phrase Phonation: Low vocal intensity Articulation: Impaired Level of Impairment: Phrase Intelligibility: Intelligibility reduced Phrase: 25-49% accurate   GO                    Maxcine Hamaiewonsky, Brees Hounshell 09/01/2016, 12:06 PM  Maxcine HamLaura Paiewonsky, M.A. CCC-SLP [phone]

## 2016-09-01 NOTE — Procedures (Signed)
ELECTROENCEPHALOGRAM REPORT  Date of Study: 09/01/2016  Patient's Name: Evelyn OdorCarol Vasseur MRN: [id number] Date of Birth: October 07, 1934  Referring Provider: Dr. Marvel PlanJindong Xu  Clinical History: This is a 82 year old woman with ICH and altered mental status.  Medications: acetaminophen (TYLENOL) tablet 650 mg  clevidipine (CLEVIPREX) infusion 0.5 mg/mL  divalproex (DEPAKOTE) DR tablet 250 mg  donepezil (ARICEPT) tablet 10 mg  enalapril (VASOTEC) tablet 10 mg  famotidine (PEPCID) tablet 20 mg  MEDLINE mouth rinse  metoprolol tartrate (LOPRESSOR) tablet 25 mg  morphine 2 MG/ML injection 1-4 mg  senna-docusate (Senokot-S) tablet 1 tablet   Technical Summary: A multichannel digital EEG recording measured by the international 10-20 system with electrodes applied with paste and impedances below 5000 ohms performed as portable with EKG monitoring in lethargic patient.  Hyperventilation and photic stimulation were not performed.  The digital EEG was referentially recorded, reformatted, and digitally filtered in a variety of bipolar and referential montages for optimal display.   Description: The patient is lethargic during the recording.  There is no clear posterior dominant rhythm. The background consists of a large amount of diffuse 4-5 Hz theta and 2-3 Hz delta slowing, with some asymmetry noted with lower voltage activity seen over the right temporal region. Normal sleep architecture is not seen. Hyperventilation and photic stimulation were not performed.  There were no epileptiform discharges or electrographic seizures seen.    EKG lead showed occasional extrasystolic beats.  Impression: This EEG is abnormal due to moderate diffuse slowing of the background with suppression over the right temporal region.  Clinical Correlation of the above findings indicates diffuse cerebral dysfunction that is non-specific in etiology and can be seen with hypoxic/ischemic injury, toxic/metabolic encephalopathies,  neurodegenerative disorders, or medication effect.  Asymmetry with lower voltage activity over the right temporal region indicates focal cerebral dysfunction in this region suggestive of underlying structural abnormality. The absence of epileptiform discharges does not rule out a clinical diagnosis of epilepsy.  Clinical correlation is advised.   Patrcia DollyKaren Chaska Hagger, M.D.

## 2016-09-01 NOTE — Progress Notes (Signed)
EEG Completed; Results Pending  

## 2016-09-01 NOTE — Progress Notes (Signed)
STROKE TEAM PROGRESS NOTE   SUBJECTIVE (INTERVAL HISTORY) No family is at the bedside.  No neuro changes overnight. Still on cleviprex. BP controlled, goal < 140 due to concerns of CAA.      OBJECTIVE Temp:  [98.4 F (36.9 C)-102.4 F (39.1 C)] 99.1 F (37.3 C) (03/12 0800) Pulse Rate:  [61-104] 63 (03/12 0800) Cardiac Rhythm: Normal sinus rhythm;Heart block (03/12 0800) Resp:  [12-26] 12 (03/12 0800) BP: (107-154)/(48-128) 112/52 (03/12 0800) SpO2:  [69 %-100 %] 97 % (03/12 0800)  CBC:   Recent Labs Lab 08/29/16 0926  08/31/16 0448 09/01/16 0239  WBC 15.1*  < > 12.9* 13.7*  NEUTROABS 12.3*  --   --   --   HGB 14.6  < > 13.7 13.6  HCT 43.9  < > 41.0 40.3  MCV 88.5  < > 88.0 88.0  PLT 244  < > 241 250  < > = values in this interval not displayed.  Basic Metabolic Panel:   Recent Labs Lab 08/31/16 0448 09/01/16 0239  NA 137 137  K 3.3* 3.0*  CL 102 100*  CO2 25 25  GLUCOSE 107* 104*  BUN 15 15  CREATININE 0.67 0.58  CALCIUM 9.2 9.1    Lipid Panel:     Component Value Date/Time   CHOL 148 08/30/2016 0346   TRIG 70 08/30/2016 0346   HDL 45 08/30/2016 0346   CHOLHDL 3.3 08/30/2016 0346   VLDL 14 08/30/2016 0346   LDLCALC 89 08/30/2016 0346   HgbA1c:  Lab Results  Component Value Date   HGBA1C 5.6 08/30/2016   Urine Drug Screen: No results found for: LABOPIA, COCAINSCRNUR, LABBENZ, AMPHETMU, THCU, LABBARB    IMAGING I have personally reviewed the radiological images below and agree with the radiology interpretations.  Ct Head Wo Contrast 08/29/2016 1. No significant interval change in size of a large intraparenchymal hematoma within the right posterior temporal and parietal lobes with surrounding edema. Bilateral intraventricular hemorrhages are again noted. There is subarachnoid hemorrhage overlying the right intraparenchymal hematoma which does not appear significantly changed. A small amount of left parietal subarachnoid hemorrhage is also unchanged.   2. The ventricles are enlarged but stable in size compared to prior CT. No significant midline shift.  3. Atrophy and periventricular small vessel ischemic changes.   Ct Head Wo Contrast 08/29/2016 1. Large acute hematoma involving the right parietal and temporal lobes as described (estimated volume 34.6 ml). This likely represents a hemorrhagic stroke. There is associated subarachnoid and intraventricular hemorrhage.  2. Mild ventricular dilatation is likely related to atrophy. No midline shift.   Dg Chest Portable 1 View 08/29/2016 Left pacer is in place with leads in the right atrium and right ventricle.  No active disease.   Ct Head Wo Contrast 08/30/2016 IMPRESSION: 1. Expected evolution of large right parietal, temporal, and occipital lobe hemorrhage with extension into the right lateral ventricle. 2. Stable subarachnoid hemorrhage on the right. 3. Stable intraventricular hemorrhage without significant change in ventricular caliber. 4. Stable effacement of the right lateral ventricle and minimal midline shift.    TTE - Mild LVH with LVEF 65-70% and grade 1 diastolic dysfunction.   Calcified mitral annulus with trivial mitral regurgitation.   Sclerotic aortic valve without stenosis. Apparent device wire   present within the right heart.   PHYSICAL EXAM  Temp:  [98.4 F (36.9 C)-102.4 F (39.1 C)] 99.1 F (37.3 C) (03/12 0800) Pulse Rate:  [61-104] 63 (03/12 0800) Resp:  [12-26] 12 (03/12  0800) BP: (107-154)/(48-128) 112/52 (03/12 0800) SpO2:  [69 %-100 %] 97 % (03/12 0800)  General - Well nourished, well developed, not in acute distress.  Ophthalmologic - Fundi not visualized due to noncooperation.  Cardiovascular - Regular rate and rhythm.  Neuro - sleepy but easily arousable and open eyes, orientated to name only, not orientated to place time or people. Able to repeat simple sentences, not able to name. Able to have simple sentence, but not following commands well. Eyes  attending to both sides, no gaze deviation. However, seems to have left hemianopia. No significant facial asymmetry, not follow commands for tongue protrusion. BUE 3/5 and BLE 3-/5 but lack of effort, no lateralizing weakness. BUE postural tremor with cog wheeling. DTR 1+ and no babinski. Sensation, coordination and gait not tested.    ASSESSMENT/PLAN Ms. Evelyn Figueroa is a 82 y.o. female with history of dementia, permanent pacemaker, and hypertension presenting with AMS and weakness. She did not receive IV t-PA due to hemorrhage.  Stroke: moderate to large ICH within the right posterior temporal and parietal lobes ~ 34.6 ml, etiology unclear, concerning for CAA. Hypertensive bleed is also possible.  Resultant left hemianopia  MRI / MRA - PPM manufacturer unknown  CT head - right posterior temporal and parietal ICH  Repeat CT head x 2 - stable hematoma  Carotid Doppler - not indicated  2D Echo - EF 65-70%  LDL - 89  HgbA1c - 5.6  VTE prophylaxis - SCDs DIET - DYS 1 Room service appropriate? Yes; Fluid consistency: Honey Thick  aspirin 81 mg daily prior to admission, now on No antithrombotic. Due to concern of CAA, recommend no antiplatelet or anticoagulation in the future.   Ongoing aggressive stroke risk factor management  Therapy recommendations: SNF recommended  Disposition: Pending  Dementia  Memory unit NH resident  Question about CAA as the cause of lobar hemorrhage  MRI not able to be done due to pacer  Daughter confirmed pt in SNF wheelchair bound  Resume aricept  Hypertension  Stable - on Cleviprex  BP goal < 140 due to concerns of CAA  Resume home BP meds - increase enalapril to 20mg  bid, and metoprolol to 25mg  bid  Wean off cleviprex as able  Hyperlipidemia  Home meds:  Lipitor 20 mg daily not resumed in hospital  LDL 89, goal < 70  Avoid statin due to concern of CAA and high risk of bleeding  Other Stroke Risk Factors  Advanced  age  Concerning for CAA  Other Active Problems  Mild leukocytosis - 15.1 -> 12.3 -> 12.9->13.7  Hypokalemia - 3.3->3.0 - supplement - recheck  Pacemaker in situ  Limited DNR - all other interventions short of cardiopulmonary resuscitation.  Hospital day # 3  This patient is critically ill due to large lobar ICH, dementia, HTN and at significant risk of neurological worsening, death form recurrent ICH, cerebral edema, delirium, seizure. This patient's care requires constant monitoring of vital signs, hemodynamics, respiratory and cardiac monitoring, review of multiple databases, neurological assessment, discussion with family, other specialists and medical decision making of high complexity. I spent 35 minutes of neurocritical care time in the care of this patient.   Marvel PlanJindong Shilee Biggs, MD PhD Stroke Neurology 09/01/2016 9:47 AM   To contact Stroke Continuity provider, please refer to [email or web address]. After hours, contact General Neurology

## 2016-09-02 ENCOUNTER — Inpatient Hospital Stay (HOSPITAL_COMMUNITY): Payer: Medicare Other

## 2016-09-02 LAB — CBC
HCT: 41.6 % (ref 36.0–46.0)
Hemoglobin: 13.9 g/dL (ref 12.0–15.0)
MCH: 29.6 pg (ref 26.0–34.0)
MCHC: 33.4 g/dL (ref 30.0–36.0)
MCV: 88.5 fL (ref 78.0–100.0)
Platelets: 258 10*3/uL (ref 150–400)
RBC: 4.7 MIL/uL (ref 3.87–5.11)
RDW: 13 % (ref 11.5–15.5)
WBC: 11.6 10*3/uL — ABNORMAL HIGH (ref 4.0–10.5)

## 2016-09-02 LAB — COMPREHENSIVE METABOLIC PANEL
ALT: 16 U/L (ref 14–54)
AST: 17 U/L (ref 15–41)
Albumin: 2.8 g/dL — ABNORMAL LOW (ref 3.5–5.0)
Alkaline Phosphatase: 68 U/L (ref 38–126)
Anion gap: 8 (ref 5–15)
BUN: 15 mg/dL (ref 6–20)
CHLORIDE: 103 mmol/L (ref 101–111)
CO2: 30 mmol/L (ref 22–32)
CREATININE: 0.65 mg/dL (ref 0.44–1.00)
Calcium: 9.4 mg/dL (ref 8.9–10.3)
Glucose, Bld: 105 mg/dL — ABNORMAL HIGH (ref 65–99)
POTASSIUM: 3.6 mmol/L (ref 3.5–5.1)
Sodium: 141 mmol/L (ref 135–145)
Total Bilirubin: 1 mg/dL (ref 0.3–1.2)
Total Protein: 5.7 g/dL — ABNORMAL LOW (ref 6.5–8.1)

## 2016-09-02 LAB — URINALYSIS, COMPLETE (UACMP) WITH MICROSCOPIC
Bacteria, UA: NONE SEEN
Bilirubin Urine: NEGATIVE
GLUCOSE, UA: NEGATIVE mg/dL
HGB URINE DIPSTICK: NEGATIVE
Ketones, ur: 5 mg/dL — AB
Leukocytes, UA: NEGATIVE
NITRITE: NEGATIVE
PROTEIN: NEGATIVE mg/dL
Specific Gravity, Urine: 1.011 (ref 1.005–1.030)
pH: 6 (ref 5.0–8.0)

## 2016-09-02 MED ORDER — HYDRALAZINE HCL 20 MG/ML IJ SOLN
10.0000 mg | INTRAMUSCULAR | Status: DC | PRN
  Administered 2016-09-02: 20 mg via INTRAVENOUS
  Administered 2016-09-03: 10 mg via INTRAVENOUS
  Filled 2016-09-02 (×3): qty 1

## 2016-09-02 MED ORDER — AMLODIPINE BESYLATE 10 MG PO TABS
10.0000 mg | ORAL_TABLET | Freq: Every day | ORAL | Status: DC
  Administered 2016-09-02 – 2016-09-05 (×4): 10 mg via ORAL
  Filled 2016-09-02 (×4): qty 1

## 2016-09-02 MED ORDER — HEPARIN SODIUM (PORCINE) 5000 UNIT/ML IJ SOLN
5000.0000 [IU] | Freq: Two times a day (BID) | INTRAMUSCULAR | Status: DC
  Administered 2016-09-02 – 2016-09-05 (×6): 5000 [IU] via SUBCUTANEOUS
  Filled 2016-09-02 (×6): qty 1

## 2016-09-02 NOTE — Progress Notes (Addendum)
STROKE TEAM PROGRESS NOTE   SUBJECTIVE (INTERVAL HISTORY) No family is at the bedside.  No neuro changes overnight. Still on cleviprex. BP controlled, goal < 140 due to concerns of CAA. Add amlodipine for weaning off cleviprex.    OBJECTIVE Temp:  [97.8 F (36.6 C)-101 F (38.3 C)] 99.4 F (37.4 C) (03/13 0800) Pulse Rate:  [56-101] 66 (03/13 0900) Cardiac Rhythm: Normal sinus rhythm;Heart block (03/13 0800) Resp:  [11-28] 12 (03/13 0900) BP: (102-162)/(51-145) 143/59 (03/13 0900) SpO2:  [83 %-100 %] 100 % (03/13 0900)  CBC:   Recent Labs Lab 08/29/16 0926  09/01/16 0239 09/02/16 0349  WBC 15.1*  < > 13.7* 11.6*  NEUTROABS 12.3*  --   --   --   HGB 14.6  < > 13.6 13.9  HCT 43.9  < > 40.3 41.6  MCV 88.5  < > 88.0 88.5  PLT 244  < > 250 258  < > = values in this interval not displayed.  Basic Metabolic Panel:   Recent Labs Lab 09/01/16 0239 09/02/16 0349  NA 137 141  K 3.0* 3.6  CL 100* 103  CO2 25 30  GLUCOSE 104* 105*  BUN 15 15  CREATININE 0.58 0.65  CALCIUM 9.1 9.4    Lipid Panel:     Component Value Date/Time   CHOL 148 08/30/2016 0346   TRIG 70 08/30/2016 0346   HDL 45 08/30/2016 0346   CHOLHDL 3.3 08/30/2016 0346   VLDL 14 08/30/2016 0346   LDLCALC 89 08/30/2016 0346   HgbA1c:  Lab Results  Component Value Date   HGBA1C 5.6 08/30/2016   Urine Drug Screen: No results found for: LABOPIA, COCAINSCRNUR, LABBENZ, AMPHETMU, THCU, LABBARB    IMAGING I have personally reviewed the radiological images below and agree with the radiology interpretations.  Ct Head Wo Contrast 08/29/2016 1. No significant interval change in size of a large intraparenchymal hematoma within the right posterior temporal and parietal lobes with surrounding edema. Bilateral intraventricular hemorrhages are again noted. There is subarachnoid hemorrhage overlying the right intraparenchymal hematoma which does not appear significantly changed. A small amount of left parietal  subarachnoid hemorrhage is also unchanged.  2. The ventricles are enlarged but stable in size compared to prior CT. No significant midline shift.  3. Atrophy and periventricular small vessel ischemic changes.   Ct Head Wo Contrast 08/29/2016 1. Large acute hematoma involving the right parietal and temporal lobes as described (estimated volume 34.6 ml). This likely represents a hemorrhagic stroke. There is associated subarachnoid and intraventricular hemorrhage.  2. Mild ventricular dilatation is likely related to atrophy. No midline shift.   Dg Chest Portable 1 View 08/29/2016 Left pacer is in place with leads in the right atrium and right ventricle.  No active disease.   Ct Head Wo Contrast 08/30/2016 IMPRESSION: 1. Expected evolution of large right parietal, temporal, and occipital lobe hemorrhage with extension into the right lateral ventricle. 2. Stable subarachnoid hemorrhage on the right. 3. Stable intraventricular hemorrhage without significant change in ventricular caliber. 4. Stable effacement of the right lateral ventricle and minimal midline shift.    TTE - Mild LVH with LVEF 65-70% and grade 1 diastolic dysfunction.   Calcified mitral annulus with trivial mitral regurgitation.   Sclerotic aortic valve without stenosis. Apparent device wire   present within the right heart.  EEG -  This EEG is abnormal due to moderate diffuse slowing of the background with suppression over the right temporal region. Clinical Correlation of the above  findings indicates diffuse cerebral dysfunction that is non-specific in etiology and can be seen with hypoxic/ischemic injury, toxic/metabolic encephalopathies, neurodegenerative disorders, or medication effect.  Asymmetry with lower voltage activity over the right temporal region indicates focal cerebral dysfunction in this region suggestive of underlying structural abnormality. The absence of epileptiform discharges does not rule out a clinical diagnosis  of epilepsy.  Clinical correlation is advised.   PHYSICAL EXAM  Temp:  [97.8 F (36.6 C)-101 F (38.3 C)] 99.4 F (37.4 C) (03/13 0800) Pulse Rate:  [56-101] 66 (03/13 0900) Resp:  [11-28] 12 (03/13 0900) BP: (102-162)/(51-145) 143/59 (03/13 0900) SpO2:  [83 %-100 %] 100 % (03/13 0900)  General - Well nourished, well developed, not in acute distress.  Ophthalmologic - Fundi not visualized due to noncooperation.  Cardiovascular - Regular rate and rhythm.  Neuro - sleepy but easily arousable and open eyes, orientated to name only, not orientated to place time or people. Able to repeat simple sentences, not able to name. Able to have simple sentence, but not following commands well. Eyes attending to both sides, no gaze deviation. However, seems to have left hemianopia. No significant facial asymmetry, not follow commands for tongue protrusion. BUE 3/5 and BLE 3-/5 but lack of effort, no lateralizing weakness. BUE postural tremor with cog wheeling. DTR 1+ and no babinski. Sensation, coordination and gait not tested.    ASSESSMENT/PLAN Ms. Evelyn Figueroa is a 82 y.o. female with history of dementia, permanent pacemaker, and hypertension presenting with AMS and weakness. She did not receive IV t-PA due to hemorrhage.  Stroke: moderate to large ICH within the right posterior temporal and parietal lobes ~ 34.6 ml, etiology unclear, concerning for CAA. Hypertensive bleed is also possible.  Resultant left hemianopia  MRI / MRA - PPM manufacturer unknown  CT head - right posterior temporal and parietal ICH  Repeat CT head x 2 - stable hematoma  Carotid Doppler - not indicated  EEG no seizure  2D Echo - EF 65-70%  LDL - 89  HgbA1c - 5.6  VTE prophylaxis - SCDs DIET - DYS 1 Room service appropriate? Yes; Fluid consistency: Thin  aspirin 81 mg daily prior to admission, now on No antithrombotic. Due to concern of CAA, recommend no antiplatelet or anticoagulation in the future.    Ongoing aggressive stroke risk factor management  Therapy recommendations: SNF recommended  Disposition: Pending  Cerebral edema  Mild surrounding edema around hematoma  No significant midline shift  No indication for hypertonic saline at this time  Consider hypertonic saline if needed   Dementia  Memory unit NH resident  Question about CAA as the cause of lobar hemorrhage  MRI not able to be done due to pacer  Daughter confirmed pt in SNF wheelchair bound  Resume aricept  Hypertension  Stable - on Cleviprex  BP goal < 140 due to concerns of CAA  Resume home BP meds - increase enalapril to 20mg  bid, and metoprolol to 25mg  bid  Add amlodipine 10mg   Wean off cleviprex as able  Hyperlipidemia  Home meds:  Lipitor 20 mg daily not resumed in hospital  LDL 89, goal < 70  Avoid statin due to concern of CAA and high risk of bleeding  Other Stroke Risk Factors  Advanced age  Concerning for CAA  Other Active Problems  Mild leukocytosis - 15.1 -> 12.3 -> 12.9->13.7->11.6  Hypokalemia - 3.3->3.0->3.6  Pacemaker in situ  Limited DNR - all other interventions short of cardiopulmonary resuscitation.  Hospital day #  4  This patient is critically ill due to large lobar ICH, dementia, HTN and at significant risk of neurological worsening, death form recurrent ICH, cerebral edema, delirium, seizure. This patient's care requires constant monitoring of vital signs, hemodynamics, respiratory and cardiac monitoring, review of multiple databases, neurological assessment, discussion with family, other specialists and medical decision making of high complexity. I spent 35 minutes of neurocritical care time in the care of this patient.   Marvel Plan, MD PhD Stroke Neurology 09/02/2016 10:24 AM   To contact Stroke Continuity provider, please refer to [email or web address]. After hours, contact General Neurology

## 2016-09-02 NOTE — Progress Notes (Signed)
Physical Therapy Treatment Patient Details Name: Evelyn Figueroa MRN: [id number] DOB: August 29, 1934 Today's Date: 09/02/2016    History of Present Illness Pt is a 82 y.o. female with h/o dementia and HTN. Pt presenting with L sided weakness and lethargy. CT on 3/9 showed a large intraparenchymal hematoma within the R posterior temporal and parietal lobes.    PT Comments    Patient tolerated sitting EOB ~8 minutes with total A performing rhythmic anterior/posterior and lateral trunk sway to help decrease resistance as pt pushing posteriorly and resisting any movement. Pt fearful of falling. Following 1 step simple commands inconsistently. Pt with lethargy as well. Will continue to follow and progress as tolerated/allowed.   Follow Up Recommendations  SNF;Supervision/Assistance - 24 hour     Equipment Recommendations  None recommended by PT    Recommendations for Other Services       Precautions / Restrictions Precautions Precautions: Fall Restrictions Weight Bearing Restrictions: No    Mobility  Bed Mobility Overal bed mobility: Needs Assistance Bed Mobility: Supine to Sit;Sit to Supine     Supine to sit: Max assist;+2 for physical assistance Sit to supine: Max assist;+2 for physical assistance   General bed mobility comments: Assist for LEs and trunk with use of bed pad and helicopter technique. Pt appears fearful of falling, tight grip on bed rail and posterior lean resistive to movement.  Transfers                 General transfer comment: Not assessed secondary to lethargy and severe posterior lean/resisting movements.  Ambulation/Gait                 Stairs            Wheelchair Mobility    Modified Rankin (Stroke Patients Only)       Balance Overall balance assessment: Needs assistance Sitting-balance support: Feet supported;Bilateral upper extremity supported Sitting balance-Leahy Scale: Poor Sitting balance - Comments: Posterior lean  in sitting, resistive to anterior lean even with cues and encouragement. Attempted ant/post sway and lateral sway in rhythmic movement. Postural control: Posterior lean                          Cognition Arousal/Alertness: Lethargic Behavior During Therapy: Flat affect Overall Cognitive Status: Impaired/Different from baseline Area of Impairment: Attention;Following commands;Problem solving   Current Attention Level: Focused (with moments of sustained)   Following Commands: Follows one step commands inconsistently;Follows one step commands with increased time     Problem Solving: Slow processing;Decreased initiation;Requires verbal cues General Comments: Hx of dementia. Pt inconsistently responding appropriately to commands. Fearful of movement.    Exercises      General Comments General comments (skin integrity, edema, etc.): VSS. BP in parameters ~130s Systolic.      Pertinent Vitals/Pain Pain Assessment: Faces Faces Pain Scale: No hurt    Home Living                      Prior Function            PT Goals (current goals can now be found in the care plan section) Progress towards PT goals: Not progressing toward goals - comment (secondary to fear, resistance to movement)    Frequency    Min 2X/week      PT Plan Current plan remains appropriate    Co-evaluation             End of Session  Activity Tolerance: Patient limited by fatigue;Patient limited by lethargy;Other (comment) (fear) Patient left: in bed;with call bell/phone within reach;with bed alarm set;with SCD's reapplied Nurse Communication: Mobility status PT Visit Diagnosis: Other symptoms and signs involving the nervous system (Z61.096)     Time: 0454-0981 PT Time Calculation (min) (ACUTE ONLY): 17 min  Charges:  $Therapeutic Activity: 8-22 mins                    G Codes:       Evelyn Figueroa 09/02/2016, 9:23 AM Evelyn Figueroa, PT, DPT [phone]

## 2016-09-03 LAB — COMPREHENSIVE METABOLIC PANEL
ALBUMIN: 2.8 g/dL — AB (ref 3.5–5.0)
ALT: 15 U/L (ref 14–54)
AST: 18 U/L (ref 15–41)
Alkaline Phosphatase: 69 U/L (ref 38–126)
Anion gap: 7 (ref 5–15)
BUN: 18 mg/dL (ref 6–20)
CHLORIDE: 104 mmol/L (ref 101–111)
CO2: 30 mmol/L (ref 22–32)
CREATININE: 0.66 mg/dL (ref 0.44–1.00)
Calcium: 9.2 mg/dL (ref 8.9–10.3)
GFR calc Af Amer: >60 mL/min (ref >60)
GFR calc non Af Amer: >60 mL/min (ref >60)
Glucose, Bld: 102 mg/dL — ABNORMAL HIGH (ref 65–99)
POTASSIUM: 3.3 mmol/L — AB (ref 3.5–5.1)
SODIUM: 141 mmol/L (ref 135–145)
Total Bilirubin: 1 mg/dL (ref 0.3–1.2)
Total Protein: 5.5 g/dL — ABNORMAL LOW (ref 6.5–8.1)

## 2016-09-03 LAB — CBC
HCT: 41.1 % (ref 36.0–46.0)
Hemoglobin: 13.5 g/dL (ref 12.0–15.0)
MCH: 29.5 pg (ref 26.0–34.0)
MCHC: 32.8 g/dL (ref 30.0–36.0)
MCV: 89.7 fL (ref 78.0–100.0)
PLATELETS: 281 10*3/uL (ref 150–400)
RBC: 4.58 MIL/uL (ref 3.87–5.11)
RDW: 13.2 % (ref 11.5–15.5)
WBC: 11.5 10*3/uL — AB (ref 4.0–10.5)

## 2016-09-03 LAB — CULTURE, BLOOD (ROUTINE X 2)
CULTURE: NO GROWTH
Culture: NO GROWTH

## 2016-09-03 NOTE — Progress Notes (Signed)
STROKE TEAM PROGRESS NOTE   SUBJECTIVE (INTERVAL HISTORY) No family is at the bedside. I updated her daughter over the phone today. Pt no neuro changes overnight. Transferred to floor last night. PT/OT recommended SNF. Social worker aware.     OBJECTIVE Temp:  [98 F (36.7 C)-99.3 F (37.4 C)] 98.5 F (36.9 C) (03/14 2112) Pulse Rate:  [60-72] 71 (03/14 2112) Cardiac Rhythm: Atrial paced (03/14 2116) Resp:  [16-20] 20 (03/14 2112) BP: (120-171)/(60-80) 171/61 (03/14 2112) SpO2:  [95 %-99 %] 95 % (03/14 2112)  CBC:   Recent Labs Lab 08/29/16 0926  09/02/16 0349 09/03/16 0311  WBC 15.1*  < > 11.6* 11.5*  NEUTROABS 12.3*  --   --   --   HGB 14.6  < > 13.9 13.5  HCT 43.9  < > 41.6 41.1  MCV 88.5  < > 88.5 89.7  PLT 244  < > 258 281  < > = values in this interval not displayed.  Basic Metabolic Panel:   Recent Labs Lab 09/02/16 0349 09/03/16 0311  NA 141 141  K 3.6 3.3*  CL 103 104  CO2 30 30  GLUCOSE 105* 102*  BUN 15 18  CREATININE 0.65 0.66  CALCIUM 9.4 9.2    Lipid Panel:     Component Value Date/Time   CHOL 148 08/30/2016 0346   TRIG 70 08/30/2016 0346   HDL 45 08/30/2016 0346   CHOLHDL 3.3 08/30/2016 0346   VLDL 14 08/30/2016 0346   LDLCALC 89 08/30/2016 0346   HgbA1c:  Lab Results  Component Value Date   HGBA1C 5.6 08/30/2016   Urine Drug Screen: No results found for: LABOPIA, COCAINSCRNUR, LABBENZ, AMPHETMU, THCU, LABBARB    IMAGING I have personally reviewed the radiological images below and agree with the radiology interpretations.  Ct Head Wo Contrast 08/29/2016 1. No significant interval change in size of a large intraparenchymal hematoma within the right posterior temporal and parietal lobes with surrounding edema. Bilateral intraventricular hemorrhages are again noted. There is subarachnoid hemorrhage overlying the right intraparenchymal hematoma which does not appear significantly changed. A small amount of left parietal subarachnoid  hemorrhage is also unchanged.  2. The ventricles are enlarged but stable in size compared to prior CT. No significant midline shift.  3. Atrophy and periventricular small vessel ischemic changes.   Ct Head Wo Contrast 08/29/2016 1. Large acute hematoma involving the right parietal and temporal lobes as described (estimated volume 34.6 ml). This likely represents a hemorrhagic stroke. There is associated subarachnoid and intraventricular hemorrhage.  2. Mild ventricular dilatation is likely related to atrophy. No midline shift.   Dg Chest Portable 1 View 08/29/2016 Left pacer is in place with leads in the right atrium and right ventricle.  No active disease.   Ct Head Wo Contrast 08/30/2016 IMPRESSION: 1. Expected evolution of large right parietal, temporal, and occipital lobe hemorrhage with extension into the right lateral ventricle. 2. Stable subarachnoid hemorrhage on the right. 3. Stable intraventricular hemorrhage without significant change in ventricular caliber. 4. Stable effacement of the right lateral ventricle and minimal midline shift.    TTE - Mild LVH with LVEF 65-70% and grade 1 diastolic dysfunction.   Calcified mitral annulus with trivial mitral regurgitation.   Sclerotic aortic valve without stenosis. Apparent device wire   present within the right heart.  EEG -  This EEG is abnormal due to moderate diffuse slowing of the background with suppression over the right temporal region. Clinical Correlation of the above findings  indicates diffuse cerebral dysfunction that is non-specific in etiology and can be seen with hypoxic/ischemic injury, toxic/metabolic encephalopathies, neurodegenerative disorders, or medication effect.  Asymmetry with lower voltage activity over the right temporal region indicates focal cerebral dysfunction in this region suggestive of underlying structural abnormality. The absence of epileptiform discharges does not rule out a clinical diagnosis of epilepsy.   Clinical correlation is advised.   PHYSICAL EXAM  Temp:  [98 F (36.7 C)-99.3 F (37.4 C)] 98.5 F (36.9 C) (03/14 2112) Pulse Rate:  [60-72] 71 (03/14 2112) Resp:  [16-20] 20 (03/14 2112) BP: (120-171)/(60-80) 171/61 (03/14 2112) SpO2:  [95 %-99 %] 95 % (03/14 2112)  General - Well nourished, well developed, not in acute distress.  Ophthalmologic - Fundi not visualized due to noncooperation.  Cardiovascular - Regular rate and rhythm.  Neuro - awake, alert, orientated to name only, not orientated to place, age, time or people. Able to repeat simple sentences, not able to name. Able to have simple sentence, but not following commands well. Eyes attending to both sides, no gaze deviation. However, seems to have left hemianopia. No significant facial asymmetry, not follow commands for tongue protrusion. BUE 3/5 and BLE 3-/5 but lack of effort, no lateralizing weakness. BUE postural tremor with cog wheeling. DTR 1+ and no babinski. Sensation, coordination and gait not tested.    ASSESSMENT/PLAN Evelyn Figueroa is a 82 y.o. female with history of dementia, permanent pacemaker, and hypertension presenting with AMS and weakness. She did not receive IV t-PA due to hemorrhage.  Stroke: moderate to large ICH within the right posterior temporal and parietal lobes ~ 34.6 ml, etiology unclear, concerning for CAA. Hypertensive bleed is also possible.  Resultant left hemianopia  MRI / MRA - PPM manufacturer unknown  CT head - right posterior temporal and parietal ICH  Repeat CT head x 2 - stable hematoma  Carotid Doppler - not indicated  EEG no seizure  2D Echo - EF 65-70%  LDL - 89  HgbA1c - 5.6  VTE prophylaxis - SCDs DIET - DYS 1 Room service appropriate? Yes; Fluid consistency: Thin  aspirin 81 mg daily prior to admission, now on No antithrombotic. Due to concern of CAA, recommend no antiplatelet or anticoagulation in the future.   Ongoing aggressive stroke risk factor  management  Therapy recommendations: SNF recommended  Disposition: Pending  Cerebral edema  Mild surrounding edema around hematoma  No significant midline shift  No indication for hypertonic saline at this time  Consider hypertonic saline if needed   Dementia  Memory unit NH resident  Question about CAA as the cause of lobar hemorrhage  MRI not able to be done due to pacer  Daughter confirmed pt in SNF wheelchair bound  Resume aricept  Hypertension  Stable - on Cleviprex  BP goal < 140 due to concerns of CAA  Resume home BP meds - increase enalapril to 20mg  bid, and metoprolol to 25mg  bid  Add amlodipine 10mg   Wean off cleviprex as able  Hyperlipidemia  Home meds:  Lipitor 20 mg daily not resumed in hospital  LDL 89, goal < 70  Avoid statin due to concern of CAA and high risk of bleeding  Other Stroke Risk Factors  Advanced age  Concerning for CAA  Other Active Problems  Mild leukocytosis - 15.1 -> 12.3 -> 12.9->13.7->11.6-> 11.5  Hypokalemia - 3.3->3.0->3.6-> 3.3  Pacemaker in situ  Limited DNR - all other interventions short of cardiopulmonary resuscitation.  Hospital day # 5  Marvel Plan, MD PhD Stroke Neurology 09/03/2016 10:40 PM   To contact Stroke Continuity provider, please refer to [email or web address]. After hours, contact General Neurology

## 2016-09-03 NOTE — Progress Notes (Addendum)
Occupational Therapy Treatment Patient Details Name: Evelyn Figueroa MRN: [id number] DOB: March 12, 1935 Today's Date: 09/03/2016    History of present illness Pt is a 82 y.o. female with h/o dementia and HTN. Pt presenting with L sided weakness and lethargy. CT on 3/9 showed a large intraparenchymal hematoma within the R posterior temporal and parietal lobes.   OT comments  Pt progressing toward OT goals. She was able to follow approximately 25% of commands this session with little to no environmental distractions. She was able to complete feeding and grooming tasks with maximum assistance this session. She was able to cross midline gaze to L once during ADL with multimodal cues and able to sustain midline gaze throughout. D/C plan remains appropriate. OT will continue to follow acutely.    Follow Up Recommendations  SNF;Supervision/Assistance - 24 hour    Equipment Recommendations  Other (comment) (TBD at next venue of care.)    Recommendations for Other Services      Precautions / Restrictions Precautions Precautions: Fall       Mobility Bed Mobility Overal bed mobility: Needs Assistance Bed Mobility: Rolling Rolling: Max assist         General bed mobility comments: Max assist to roll for repositioning. Pt with tight grasp on sheets with any mobility.  Transfers                 General transfer comment: Not assessed this session.    Balance                                   ADL Overall ADL's : Needs assistance/impaired Eating/Feeding: Maximal assistance;Bed level Eating/Feeding Details (indicate cue type and reason): Sitting upright in bed. Able to initiate hand to mouth movements. Decreased fine motor coordination to manipulate utensils. Grooming: Maximal assistance;Brushing hair;Wash/dry face;Bed level Grooming Details (indicate cue type and reason): Hand over hand assistance to wash face and comb hair. She was able to complete elbow flexion to  bring washcloth to mouth to wipe mouth. B UE tightness in shoulders limiting PROM/AROM beyond hand to mouth. She was able to identify and grasp comb but required max assist to raise to brush her hair.                               General ADL Comments: Able to follow commands approximately 25% of the time with multimodal cues. Facilitated ability to locate items in L visual field with anchoring techniques. Pt responded well to this at midline on vertical surface but remained with difficulty to identify items to the L of midline with maximum cues.        Vision                 Additional Comments: Able to attend to stimulus on L side with maximum multimodal cues 1/3 trials. She startles when approached from the L which may indicate L visual field cut.   Perception     Praxis      Cognition   Behavior During Therapy: Flat affect Overall Cognitive Status: Impaired/Different from baseline Area of Impairment: Attention;Following commands;Problem solving   Current Attention Level: Sustained    Following Commands: Follows one step commands inconsistently;Follows one step commands with increased time     Problem Solving: Slow processing;Decreased initiation;Requires verbal cues General Comments: Hx of dementia. Pt responding to approximately 25 % of  commands this session with multimodal cues.      Exercises     Shoulder Instructions       General Comments      Pertinent Vitals/ Pain       Pain Assessment: Faces Faces Pain Scale: Hurts little more Pain Location: Did not specify but grimacing during movement of UE's Pain Descriptors / Indicators: Grimacing Pain Intervention(s): Monitored during session;Repositioned  Home Living                                          Prior Functioning/Environment              Frequency  Min 2X/week        Progress Toward Goals  OT Goals(current goals can now be found in the care plan  section)  Progress towards OT goals: Progressing toward goals  Acute Rehab OT Goals Patient Stated Goal: none stated OT Goal Formulation: With patient Time For Goal Achievement: 09/13/16 Potential to Achieve Goals: Fair ADL Goals Pt Will Perform Grooming: with min assist;sitting Pt Will Transfer to Toilet: with min assist;with +2 assist;stand pivot transfer;bedside commode Additional ADL Goal #1: Pt will follow one step command 75% of the time in a minimally distracting environment. Additional ADL Goal #2: Pt will attend to 3/5 items on L side of visual field with min verbal cues.  Plan Discharge plan remains appropriate    Co-evaluation                 End of Session Equipment Utilized During Treatment: Oxygen  OT Visit Diagnosis: Hemiplegia and hemiparesis Hemiplegia - Right/Left: Left Hemiplegia - caused by: Nontraumatic SAH   Activity Tolerance Patient tolerated treatment well   Patient Left in bed;with call bell/phone within reach;with bed alarm set   Nurse Communication Mobility status        Time: [id number] OT Time Calculation (min): 24 min  Charges: OT General Charges $OT Visit: 1 Procedure OT Treatments $Self Care/Home Management : 8-22 mins $Therapeutic Activity: 8-22 mins  Doristine Sectionharity A Krzysztof Reichelt, MS OTR/L  Pager: [phone]    Mikhi Athey A Srinika Delone 09/03/2016, 12:47 PM

## 2016-09-03 NOTE — Care Management Note (Signed)
Case Management Note  Patient Details  Name: Evelyn Figueroa MRN: [id number] Date of Birth: 1934-07-02  Subjective/Objective:  Patient admitted with ICH. [place] is from [place] and is wheelchair bound at baseline.             Action/Plan: Recommendations are for SNF. CM following for further d/c needs.   Expected Discharge Date:                  Expected Discharge Plan:  Skilled Nursing Facility  In-House Referral:  Clinical Social Work  Discharge planning Services     Post Acute Care Choice:    Choice offered to:     DME Arranged:    DME Agency:     HH Arranged:    HH Agency:     Status of Service:  In process, will continue to follow  If discussed at Long Length of Stay Meetings, dates discussed:    Additional Comments:  [name], RN 09/03/2016, 11:54 AM

## 2016-09-04 DIAGNOSIS — G936 Cerebral edema: Secondary | ICD-10-CM | POA: Diagnosis present

## 2016-09-04 DIAGNOSIS — I680 Cerebral amyloid angiopathy: Secondary | ICD-10-CM

## 2016-09-04 DIAGNOSIS — E876 Hypokalemia: Secondary | ICD-10-CM | POA: Diagnosis present

## 2016-09-04 DIAGNOSIS — I10 Essential (primary) hypertension: Secondary | ICD-10-CM | POA: Diagnosis present

## 2016-09-04 DIAGNOSIS — E785 Hyperlipidemia, unspecified: Secondary | ICD-10-CM | POA: Diagnosis present

## 2016-09-04 DIAGNOSIS — D72829 Elevated white blood cell count, unspecified: Secondary | ICD-10-CM | POA: Diagnosis present

## 2016-09-04 DIAGNOSIS — E854 Organ-limited amyloidosis: Secondary | ICD-10-CM | POA: Diagnosis present

## 2016-09-04 DIAGNOSIS — F0390 Unspecified dementia without behavioral disturbance: Secondary | ICD-10-CM | POA: Diagnosis present

## 2016-09-04 LAB — BASIC METABOLIC PANEL
ANION GAP: 9 (ref 5–15)
BUN: 19 mg/dL (ref 6–20)
CALCIUM: 9.3 mg/dL (ref 8.9–10.3)
CO2: 27 mmol/L (ref 22–32)
CREATININE: 0.59 mg/dL (ref 0.44–1.00)
Chloride: 107 mmol/L (ref 101–111)
Glucose, Bld: 90 mg/dL (ref 65–99)
Potassium: 3.2 mmol/L — ABNORMAL LOW (ref 3.5–5.1)
SODIUM: 143 mmol/L (ref 135–145)

## 2016-09-04 LAB — CBC
HEMATOCRIT: 38.9 % (ref 36.0–46.0)
Hemoglobin: 12.9 g/dL (ref 12.0–15.0)
MCH: 29.9 pg (ref 26.0–34.0)
MCHC: 33.2 g/dL (ref 30.0–36.0)
MCV: 90 fL (ref 78.0–100.0)
PLATELETS: 298 10*3/uL (ref 150–400)
RBC: 4.32 MIL/uL (ref 3.87–5.11)
RDW: 13.4 % (ref 11.5–15.5)
WBC: 10.6 10*3/uL — AB (ref 4.0–10.5)

## 2016-09-04 MED ORDER — ENALAPRIL MALEATE 20 MG PO TABS: 20.0000 mg | ORAL_TABLET | Freq: Two times a day (BID) | ORAL | 2 refills | Status: AC

## 2016-09-04 MED ORDER — POTASSIUM CHLORIDE CRYS ER 20 MEQ PO TBCR
40.0000 meq | EXTENDED_RELEASE_TABLET | ORAL | Status: AC
Start: 2016-09-04 — End: 2016-09-04
  Administered 2016-09-04 (×2): 40 meq via ORAL
  Filled 2016-09-04 (×2): qty 2

## 2016-09-04 MED ORDER — METOPROLOL TARTRATE 50 MG PO TABS: 50.0000 mg | ORAL_TABLET | Freq: Two times a day (BID) | ORAL | 2 refills | Status: AC

## 2016-09-04 MED ORDER — HYDRALAZINE HCL 10 MG PO TABS
10.0000 mg | ORAL_TABLET | Freq: Three times a day (TID) | ORAL | Status: DC
  Administered 2016-09-04 – 2016-09-05 (×5): 10 mg via ORAL
  Filled 2016-09-04 (×5): qty 1

## 2016-09-04 MED ORDER — AMLODIPINE BESYLATE 10 MG PO TABS: 10.0000 mg | ORAL_TABLET | Freq: Every day | ORAL | 2 refills | Status: AC

## 2016-09-04 MED ORDER — ENSURE ENLIVE PO LIQD
237.0000 mL | Freq: Two times a day (BID) | ORAL | Status: DC
  Administered 2016-09-04 – 2016-09-05 (×3): 237 mL via ORAL
  Filled 2016-09-04 (×5): qty 237

## 2016-09-04 MED ORDER — HYDRALAZINE HCL 10 MG PO TABS: 10.0000 mg | ORAL_TABLET | Freq: Three times a day (TID) | ORAL | 2 refills | Status: AC

## 2016-09-04 NOTE — Progress Notes (Signed)
Annie MainSharon Biby, NP paged and notified about NSL site.

## 2016-09-04 NOTE — [place] FL2 (Signed)
[place] MEDICAID FL2 LEVEL OF CARE SCREENING TOOL     IDENTIFICATION  Patient Name: Evelyn Figueroa Birthdate: 1935/01/25 Sex: female Admission Date (Current Location): 08/29/2016  [facility] and [place] Number:  Producer, television/film/video and Address:  [facility], [address] [place], [place] [address]      Provider Number: [id number]  Attending Physician Name and Address:  Marvel Plan, MD  Relative Name and Phone Number:       Current Level of Care: Hospital Recommended Level of Care: Skilled Nursing Facility Prior Approval Number:    Date Approved/Denied:   PASRR Number:    Discharge Plan: SNF    Current Diagnoses: Patient Active Problem List   Diagnosis Date Noted  . ICH (intracerebral hemorrhage) (HCC) 08/29/2016    Orientation RESPIRATION BLADDER Height & Weight     Self (Dementia)  Normal Incontinent, Indwelling catheter Weight: 126 lb 15.8 oz (57.6 kg) Height:  5\' 4"  (162.6 cm)  BEHAVIORAL SYMPTOMS/MOOD NEUROLOGICAL BOWEL NUTRITION STATUS      Incontinent Diet (Dys1; thin fluids)  AMBULATORY STATUS COMMUNICATION OF NEEDS Skin   Extensive Assist Verbally Normal                       Personal Care Assistance Level of Assistance  Bathing, Feeding, Dressing Bathing Assistance: Maximum assistance Feeding assistance: Maximum assistance Dressing Assistance: Maximum assistance     Functional Limitations Info  Sight, Hearing, Speech Sight Info: Impaired Hearing Info: Adequate Speech Info: Adequate (dysarthric)    SPECIAL CARE FACTORS FREQUENCY  PT (By licensed PT), OT (By licensed OT)     PT Frequency: 5x OT Frequency: 5x            Contractures Contractures Info: Not present    Additional Factors Info  Code Status, Allergies Code Status Info: DNR Allergies Info: Augmentin Amoxicillin-pot Clavulanate, Contrast Media Iodinated Diagnostic Agents, Iodine           Current Medications (09/04/2016):  This is the current  hospital active medication list Current Facility-Administered Medications  Medication Dose Route Frequency Provider Last Rate Last Dose  . 0.9 %  sodium chloride infusion   Intravenous Continuous Marvel Plan, MD 40 mL/hr at 09/02/16 0800    . acetaminophen (TYLENOL) tablet 650 mg  650 mg Oral Q4H PRN Versie Starks, MD   650 mg at 09/01/16 2007   Or  . acetaminophen (TYLENOL) solution 650 mg  650 mg Per Tube Q4H PRN Versie Starks, MD       Or  . acetaminophen (TYLENOL) suppository 650 mg  650 mg Rectal Q4H PRN Versie Starks, MD   650 mg at 08/31/16 0017  . amLODipine (NORVASC) tablet 10 mg  10 mg Oral Daily Marvel Plan, MD   10 mg at 09/03/16 1147  . divalproex (DEPAKOTE) DR tablet 250 mg  250 mg Oral BID Marvel Plan, MD   250 mg at 09/03/16 2145  . donepezil (ARICEPT) tablet 10 mg  10 mg Oral Daily Marvel Plan, MD   10 mg at 09/03/16 1148  . enalapril (VASOTEC) tablet 20 mg  20 mg Oral BID Marvel Plan, MD   20 mg at 09/03/16 2145  . famotidine (PEPCID) tablet 20 mg  20 mg Oral Daily Marvel Plan, MD   20 mg at 09/03/16 1148  . heparin injection 5,000 Units  5,000 Units Subcutaneous Q12H Marvel Plan, MD   5,000 Units at 09/03/16 2145  . hydrALAZINE (APRESOLINE)  injection 10-20 mg  10-20 mg Intravenous Q4H PRN Marvel PlanJindong Xu, MD   10 mg at 09/03/16 2344  . hydrALAZINE (APRESOLINE) tablet 10 mg  10 mg Oral Q8H Marvel PlanJindong Xu, MD   10 mg at 09/04/16 0807  . MEDLINE mouth rinse  15 mL Mouth Rinse BID Marvel PlanJindong Xu, MD   15 mL at 09/03/16 2146  . metoprolol (LOPRESSOR) tablet 50 mg  50 mg Oral BID Marvel PlanJindong Xu, MD   50 mg at 09/03/16 2145  . senna-docusate (Senokot-S) tablet 1 tablet  1 tablet Oral BID Versie Starksimothy James Oster, MD   1 tablet at 09/03/16 2145     Discharge Medications: Please see discharge summary for a list of discharge medications.  Relevant Imaging Results:  Relevant Lab Results:   Additional Information SSN: [id number]  Dominic PeaJeneya G Shakena Callari, LCSW

## 2016-09-04 NOTE — Discharge Summary (Addendum)
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 CAD (coronary artery disease)   . Dementia   . Hypertension   . Pacemaker    No past surgical history on file.  Allergies as of 09/05/2016      Reactions   Augmentin [amoxicillin-pot Clavulanate]    Per patient's MAR   Contrast Media [iodinated Diagnostic Agents]    Per patient's MAR   Iodine    Per patient's MAR      Medication List    STOP taking these medications   aspirin EC 81 MG tablet   atorvastatin 20 MG tablet Commonly known as:  LIPITOR     TAKE these medications   acetaminophen 500 MG tablet Commonly known as:  TYLENOL Take 1,000 mg by mouth every 8 (eight) hours as needed (for pain).   amLODipine 10 MG tablet Commonly known as:  NORVASC Take 1 tablet (10 mg total) by mouth daily.   calcium carbonate 500 MG chewable tablet Commonly known as:  TUMS - dosed in mg elemental calcium Chew 1 tablet by mouth every 3 (three) hours as needed for indigestion or heartburn.   divalproex 250 MG DR tablet Commonly known as:  DEPAKOTE Take 250 mg by mouth 2 (two) times daily.   donepezil 10 MG tablet Commonly known as:  ARICEPT Take 10 mg by mouth daily.   enalapril 20 MG tablet Commonly known as:  VASOTEC Take 1 tablet (20 mg total) by mouth 2 (two) times daily. What changed:  medication strength  how much to take   famotidine 20 MG  tablet Commonly known as:  PEPCID Take 20 mg by mouth 2 (two) times daily.   feeding supplement (ENSURE ENLIVE) Liqd Take 237 mLs by mouth 2 (two) times daily between meals.   hydrALAZINE 10 MG tablet Commonly known as:  APRESOLINE Take 1 tablet (10 mg total) by mouth every 8 (eight) hours.   metoprolol 50 MG tablet Commonly known as:  LOPRESSOR Take 1 tablet (50 mg total) by mouth 2 (two) times daily. What changed:  medication strength  how much to take   MULTIVITAMIN ADULT PO Take 1 tablet by mouth daily.   polyethylene glycol packet Commonly known as:  MIRALAX / GLYCOLAX Take 17 g by mouth daily. MIX IN LIQUID       LABORATORY STUDIES CBC    Component Value Date/Time   WBC 8.6 09/05/2016 0149   RBC 3.90 09/05/2016 0149   HGB 11.8 (L) 09/05/2016 0149   HCT 35.4 (L) 09/05/2016 0149   PLT 283 09/05/2016 0149   MCV 90.8 09/05/2016 0149   MCH 30.3 09/05/2016 0149   MCHC 33.3 09/05/2016 0149   RDW 14.0 09/05/2016 0149   LYMPHSABS 1.3 08/29/2016 0926   MONOABS 1.5 (H) 08/29/2016 0926   EOSABS 0.0 08/29/2016 0926   BASOSABS 0.1 08/29/2016 0926   CMP  Component Value Date/Time   NA 146 (H) 09/05/2016 0149   K 3.9 09/05/2016 0149   CL 109 09/05/2016 0149   CO2 29 09/05/2016 0149   GLUCOSE 120 (H) 09/05/2016 0149   BUN 20 09/05/2016 0149   CREATININE 0.66 09/05/2016 0149   CALCIUM 9.2 09/05/2016 0149   PROT 5.5 (L) 09/03/2016 0311   ALBUMIN 2.8 (L) 09/03/2016 0311   AST 18 09/03/2016 0311   ALT 15 09/03/2016 0311   ALKPHOS 69 09/03/2016 0311   BILITOT 1.0 09/03/2016 0311   GFRNONAA >60 09/05/2016 0149   GFRAA >60 09/05/2016 0149   COAGS Lab Results  Component Value Date   INR 1.13 08/29/2016   Lipid Panel    Component Value Date/Time   CHOL 148 08/30/2016 0346   TRIG 70 08/30/2016 0346   HDL 45 08/30/2016 0346   CHOLHDL 3.3 08/30/2016 0346   VLDL 14 08/30/2016 0346   LDLCALC 89 08/30/2016 0346   HgbA1C  Lab Results  Component Value Date    HGBA1C 5.6 08/30/2016   Urinalysis    Component Value Date/Time   COLORURINE YELLOW 09/01/2016 2243   APPEARANCEUR CLEAR 09/01/2016 2243   LABSPEC 1.011 09/01/2016 2243   PHURINE 6.0 09/01/2016 2243   GLUCOSEU NEGATIVE 09/01/2016 2243   HGBUR NEGATIVE 09/01/2016 2243   BILIRUBINUR NEGATIVE 09/01/2016 2243   KETONESUR 5 (A) 09/01/2016 2243   PROTEINUR NEGATIVE 09/01/2016 2243   NITRITE NEGATIVE 09/01/2016 2243   LEUKOCYTESUR NEGATIVE 09/01/2016 2243    SIGNIFICANT DIAGNOSTIC STUDIES Ct Head Wo Contrast 08/30/2016 1. Expected evolution of large right parietal, temporal, and occipital lobe hemorrhage with extension into the right lateral ventricle. 2. Stable subarachnoid hemorrhage on the right. 3. Stable intraventricular hemorrhage without significant change in ventricular caliber. 4. Stable effacement of the right lateral ventricle and minimal midline shift.    Ct Head Wo Contrast 08/29/2016 1. No significant interval change in size of a large intraparenchymal hematoma within the right posterior temporal and parietal lobes with surrounding edema. Bilateral intraventricular hemorrhages are again noted. There is subarachnoid hemorrhage overlying the right intraparenchymal hematoma which does not appear significantly changed. A small amount of left parietal subarachnoid hemorrhage is also unchanged.  2. The ventricles are enlarged but stable in size compared to prior CT. No significant midline shift.  3. Atrophy and periventricular small vessel ischemic changes.   Ct Head Wo Contrast 08/29/2016 1. Large acute hematoma involving the right parietal and temporal lobes as described (estimated volume 34.6 ml). This likely represents a hemorrhagic stroke. There is associated subarachnoid and intraventricular hemorrhage.  2. Mild ventricular dilatation is likely related to atrophy. No midline shift.   Dg Chest Portable 1 View 08/29/2016 Left pacer is in place with leads in the right atrium and  right ventricle. No active disease.   TTE  Mild LVH with LVEF 65-70% and grade 1 diastolic dysfunction.Calcified mitral annulus with trivial mitral regurgitation.Sclerotic aortic valve without stenosis. Apparent device wirepresent within the right heart.  EEG This EEG is abnormal due to moderatediffuse slowing of thebackground with suppression over the right temporal region. Clinical Correlation of the above findings indicates diffuse cerebral dysfunction that is non-specific in etiology and can be seen with hypoxic/ischemic injury, toxic/metabolic encephalopathies, neurodegenerative disorders, or medication effect. Asymmetry with lower voltage activity over the right temporal region indicates focal cerebral dysfunction in this region suggestive of underlying structural abnormality. The absence of epileptiform discharges does not rule out a clinical diagnosis of epilepsy. Clinical correlation is advised.  HISTORY OF PRESENT ILLNESS This is a 81-yo woman with h/o dementia who is admitted in transfer from [facility] for management of an acute ICH. History is obtained from the review of the medical record as well as from d/w transferring ED MD. The patient is unable to provide any meaningful information at this time.   She has a history of dementia, the extent of which is not clear to me at this time. She is a resident at [facility] in the Memory Care Unit. Per ED notes, staff reported that the patient had seemed weak for the past two days. This morning she was found slumped over in her wheelchair, leaning to her right side. They reported that she had a low-grade fever this morning for which she was given 500 mg of Tylenol. She was sent to the ED at [facility] for evaluation. Per ED MD notes, the patient was "very drowsy and lethargic. Will open eyes to voice. Does appear confused." She had an initial GCS of 13 with left sided weakness. Due to initial  concerns for possible UTI, she was given vancomycin 1000 mg and cefepime 2 grams. CTH was obtained and showed a large R parietal lobe hemorrhage with intraventricular and subarachnoid extension. She has required intermittent treatment for SBP >160. ED MD reports that after d/w family they requested that the patient be DNR but that she would want all other interventions short of cardiopulmonary resuscitation.  On arrival to the [facility] Neuro ICU, she remains drowsy but rouses to voice and answers simple questions. She has a left hemiparesis. Encounter is limited by encephalopathy and likely by underlying dementia as well.    HOSPITAL COURSE Evelyn Figueroa is a 82 y.o. female with history of dementia, permanent pacemaker, and hypertension presenting with AMS and weakness. She did not receive IV t-PA due to hemorrhage.  Stroke: moderate to large ICH within the right posterior temporal and parietal lobes ~ 34.6 ml, etiology unclear, concerning for Cerebral amyloid angiopathy (CAA). Hypertensive bleed is also possible.  Resultant left hemianopia  MRI / MRA - PPM manufacturer unknown  CT head - right posterior temporal and parietal ICH  Repeat CT head x 2 - stable hematoma  Carotid Doppler - not indicated  EEG no seizure  2D Echo - EF 65-70%  LDL - 89  HgbA1c - 5.6  aspirin 81 mg daily prior to admission, now on No antithrombotic. Due to concern of CAA, recommend no antiplatelet or anticoagulation in the future.   Ongoing aggressive stroke risk factor management  Therapy recommendations: SNF   Disposition: SNF  Cerebral edema  Mild surrounding edema around hematoma  No significant midline shift  Dementia  Memory unit NH resident  Question about CAA as the cause of lobar hemorrhage  MRI not able to be done due to pacer  Daughter confirmed pt in SNF wheelchair bound  Resume aricept  Hypertension  Initially treated with Cleviprex  Resumed home BP meds -  increased enalapril to 20mg  bid, and metoprolol to 25mg  bid  Added amlodipine 10mg  and Hydaralazine 10 mg tid  Hyperlipidemia  Home meds:  Lipitor 20 mg daily   LDL 89, goal < 70  Avoid statin due to concern of CAA and high risk of bleeding  Other Stroke Risk Factors  Advanced age  Concerning for CAA  Other Active Problems  Mild leukocytosis  Hypokalemia, replaced, including day of discharge. Needs follow up  Pacemaker in situ  Limited DNR -  all other interventionsshort of cardiopulmonary resuscitation.   DISCHARGE EXAM Blood pressure (!) 142/57, pulse 75, temperature 98.5 F (36.9 C), temperature source Oral, resp. rate 18, height 5\' 4"  (1.626 m), weight 126 lb 15.8 oz (57.6 kg), SpO2 98 %. General - Well nourished, well developed, not in acute distress.  Ophthalmologic - Fundi not visualized due to noncooperation.  Cardiovascular - Regular rate and rhythm.  Neuro - awake, alert, orientated to name only, not orientated to place, age, time or people. Able to repeat simple sentences, not able to name. Able to have simple sentence, but not following commands well. Eyes attending to both sides, no gaze deviation. However, seems to have left hemianopia. No significant facial asymmetry, not follow commands for tongue protrusion. BUE 3/5 and BLE 3-/5 but lack of effort, no lateralizing weakness. BUE postural tremor with cog wheeling. DTR 1+ and no babinski. Sensation, coordination and gait not tested.    Discharge Diet   DIET - DYS 1 Room service appropriate? Yes; Fluid consistency: Thin liquids  DISCHARGE PLAN  Disposition:  Discharge to skilled nursing facility for ongoing PT, OT and ST.   No antithrombotics/antiplatelets/anticoagulants life long given likely CAA  Ongoing risk factor control by Primary Care Physician at time of discharge  Follow-up No PCP Per Patient in 2 weeks.  Follow-up with Darrol Angel NP, Stroke Clinic in 6 weeks, office to schedule an  appointment.  Addendum - Patient is on Ensure Enlive supplement for poor PO intake. Patient's history of Iodine allergy noted as a contraindication. Patient's reaction to Iodine is unknown. Discussed with pharmacist and nurse on floor. Patient appears to be tolerating supplement without problem. Consensus plan is to continue supplement.  40 minutes were spent preparing discharge.  Marvel Plan, MD PhD Stroke Neurology 09/05/2016 1:54 PM

## 2016-09-04 NOTE — Progress Notes (Signed)
Initial Nutrition Assessment  DOCUMENTATION CODES:   Not applicable  INTERVENTION:  Ensure Enlive po BID, each supplement provides 350 kcal and 20 grams of protein  NUTRITION DIAGNOSIS:   Inadequate oral intake related to dysphagia, acute illness, CVA as evidenced by meal completion < 25%.  GOAL:   Patient will meet greater than or equal to 90% of their needs  MONITOR:   PO intake, Supplement acceptance, Weight trends, Labs  REASON FOR ASSESSMENT:   Low Braden    ASSESSMENT:   82 y.o. female with history of dementia, permanent pacemaker, and hypertension presenting with AMS and weakness. Found to have hemorrhagic stroke.   Visited pt's room today; pt unable to communicate and not arousable. Per chart, pt eating <25% meals. Pt is wheelchair bound at baseline and has severe muscle wasting in her lower legs. Pt had MBS on 3/11 and approved for DYS 1/thin liquid diet. There is no weight history in chart for this pt; unsure if any weight changes. RD will order Ensure. Pt to discharge to SNF today.   Medications reviewed and include: pepcid, heparin, senokot  Labs reviewed: K 3.2(L), alb 2.8(L) Wbc- 10.6(H)  Nutrition-Focused physical exam completed. Findings are no fat depletion, moderate to severe muscle depletion, and no edema.   Diet Order:  DIET - DYS 1 Room service appropriate? Yes; Fluid consistency: Thin  Skin:  Reviewed, no issues  Last BM:  3/14  Height:   Ht Readings from Last 1 Encounters:  08/29/16 5\' 4"  (1.626 m)    Weight:   Wt Readings from Last 1 Encounters:  08/29/16 126 lb 15.8 oz (57.6 kg)    Ideal Body Weight:  54.5 kg  BMI:  Body mass index is 21.8 kg/m.  Estimated Nutritional Needs:   Kcal:  1700-2000kcal/day   Protein:  80-91g/day   Fluid:  >1.7L/day   EDUCATION NEEDS:   No education needs identified at this time  Betsey Holidayasey Chantrice Hagg, RD, LDN Pager #[phone]- [phone]

## 2016-09-04 NOTE — Progress Notes (Signed)
On assessment noted LFA NSL site red, with a hard bump just above insertion site. NSL d/c'd. Site tender to touch with pt. Stating "it hurts" when touched. Will ask MD to assess on rounds.

## 2016-09-04 NOTE — [place] FL2 (Signed)
[place] MEDICAID FL2 LEVEL OF CARE SCREENING TOOL     IDENTIFICATION  Patient Name: Evelyn OdorCarol Balon Birthdate: 1935/01/31 Sex: female Admission Date (Current Location): 08/29/2016  [facility] [place]   Facility and Address:  [facility], [address], [place], [place] [address]      Provider Number: [id number]  Attending Physician Name and Address:  Marvel PlanJindong Xu, MD  Relative Name and Phone Number:       Current Level of Care: Hospital Recommended Level of Care: Skilled Nursing Facility Prior Approval Number:    Date Approved/Denied:   PASRR Number: [id number] [id number]  Discharge Plan: SNF    Current Diagnoses: Patient Active Problem List   Diagnosis Date Noted  . Possible Cerebral amyloid angiopathy (HCC) 09/04/2016  . Cerebral edema (HCC) 09/04/2016  . Dementia 09/04/2016  . Accelerated hypertension 09/04/2016  . Hyperlipidemia 09/04/2016  . Leukocytosis 09/04/2016  . Hypokalemia 09/04/2016  . ICH (intracerebral hemorrhage) (HCC) - R posterior temporal and parietal lobes, CAA vs HTN 08/29/2016    Orientation RESPIRATION BLADDER Height & Weight     Self (Dementia)  Normal Incontinent, Indwelling catheter Weight: 126 lb 15.8 oz (57.6 kg) Height:  5\' 4"  (162.6 cm)  BEHAVIORAL SYMPTOMS/MOOD NEUROLOGICAL BOWEL NUTRITION STATUS      Incontinent Diet (Dys1; thin fluids)  AMBULATORY STATUS COMMUNICATION OF NEEDS Skin   Extensive Assist Verbally Normal                       Personal Care Assistance Level of Assistance  Bathing, Feeding, Dressing Bathing Assistance: Maximum assistance Feeding assistance: Maximum assistance Dressing Assistance: Maximum assistance     Functional Limitations Info  Sight, Hearing, Speech Sight Info: Impaired Hearing Info: Adequate Speech Info: Adequate (dysarthric)    SPECIAL CARE FACTORS FREQUENCY  PT (By licensed PT), OT (By licensed OT)     PT Frequency: 5x OT Frequency: 5x             Contractures Contractures Info: Not present    Additional Factors Info  Code Status, Allergies Code Status Info: DNR Allergies Info: Augmentin Amoxicillin-pot Clavulanate, Contrast Media Iodinated Diagnostic Agents, Iodine           Current Medications (09/04/2016):  This is the current hospital active medication list Current Facility-Administered Medications  Medication Dose Route Frequency Provider Last Rate Last Dose  . 0.9 %  sodium chloride infusion   Intravenous Continuous Marvel PlanJindong Xu, MD 40 mL/hr at 09/02/16 0800    . acetaminophen (TYLENOL) tablet 650 mg  650 mg Oral Q4H PRN Versie Starksimothy James Oster, MD   650 mg at 09/01/16 2007   Or  . acetaminophen (TYLENOL) solution 650 mg  650 mg Per Tube Q4H PRN Versie Starksimothy James Oster, MD       Or  . acetaminophen (TYLENOL) suppository 650 mg  650 mg Rectal Q4H PRN Versie Starksimothy James Oster, MD   650 mg at 08/31/16 0017  . amLODipine (NORVASC) tablet 10 mg  10 mg Oral Daily Marvel PlanJindong Xu, MD   10 mg at 09/04/16 1146  . divalproex (DEPAKOTE) DR tablet 250 mg  250 mg Oral BID Marvel PlanJindong Xu, MD   250 mg at 09/04/16 1148  . donepezil (ARICEPT) tablet 10 mg  10 mg Oral Daily Marvel PlanJindong Xu, MD   10 mg at 09/04/16 1148  . enalapril (VASOTEC) tablet 20 mg  20 mg Oral BID Marvel PlanJindong Xu, MD   20 mg at 09/04/16 1146  . famotidine (  PEPCID) tablet 20 mg  20 mg Oral Daily Marvel Plan, MD   20 mg at 09/04/16 1148  . feeding supplement (ENSURE ENLIVE) (ENSURE ENLIVE) liquid 237 mL  237 mL Oral BID BM Marvel Plan, MD   237 mL at 09/04/16 1531  . heparin injection 5,000 Units  5,000 Units Subcutaneous Q12H Marvel Plan, MD   5,000 Units at 09/04/16 1146  . hydrALAZINE (APRESOLINE) injection 10-20 mg  10-20 mg Intravenous Q4H PRN Marvel Plan, MD   10 mg at 09/03/16 2344  . hydrALAZINE (APRESOLINE) tablet 10 mg  10 mg Oral Q8H Marvel Plan, MD   10 mg at 09/04/16 1531  . MEDLINE mouth rinse  15 mL Mouth Rinse BID Marvel Plan, MD   15 mL at 09/04/16 1000  . metoprolol (LOPRESSOR) tablet  50 mg  50 mg Oral BID Marvel Plan, MD   50 mg at 09/04/16 1147  . senna-docusate (Senokot-S) tablet 1 tablet  1 tablet Oral BID Versie Starks, MD   1 tablet at 09/03/16 2145     Discharge Medications: Please see discharge summary for a list of discharge medications.  Relevant Imaging Results:  Relevant Lab Results:   Additional Information SSN: [id number]  Dominic Pea, LCSW

## 2016-09-04 NOTE — Progress Notes (Signed)
STROKE TEAM PROGRESS NOTE   SUBJECTIVE (INTERVAL HISTORY) No family is at the bedside. Pt no neuro changes overnight. CSW working on placement.      OBJECTIVE Temp:  [98.1 F (36.7 C)-98.9 F (37.2 C)] 98.1 F (36.7 C) (03/15 1432) Pulse Rate:  [60-78] 78 (03/15 1432) Cardiac Rhythm: Normal sinus rhythm (03/15 0800) Resp:  [16-20] 16 (03/15 1432) BP: (127-171)/(52-67) 150/62 (03/15 1432) SpO2:  [91 %-100 %] 95 % (03/15 1432)  CBC:   Recent Labs Lab 08/29/16 0926  09/03/16 0311 09/04/16 0746  WBC 15.1*  < > 11.5* 10.6*  NEUTROABS 12.3*  --   --   --   HGB 14.6  < > 13.5 12.9  HCT 43.9  < > 41.1 38.9  MCV 88.5  < > 89.7 90.0  PLT 244  < > 281 298  < > = values in this interval not displayed.  Basic Metabolic Panel:   Recent Labs Lab 09/03/16 0311 09/04/16 0746  NA 141 143  K 3.3* 3.2*  CL 104 107  CO2 30 27  GLUCOSE 102* 90  BUN 18 19  CREATININE 0.66 0.59  CALCIUM 9.2 9.3    Lipid Panel:     Component Value Date/Time   CHOL 148 08/30/2016 0346   TRIG 70 08/30/2016 0346   HDL 45 08/30/2016 0346   CHOLHDL 3.3 08/30/2016 0346   VLDL 14 08/30/2016 0346   LDLCALC 89 08/30/2016 0346   HgbA1c:  Lab Results  Component Value Date   HGBA1C 5.6 08/30/2016   Urine Drug Screen: No results found for: LABOPIA, COCAINSCRNUR, LABBENZ, AMPHETMU, THCU, LABBARB    IMAGING I have personally reviewed the radiological images below and agree with the radiology interpretations.  Ct Head Wo Contrast 08/29/2016 1. No significant interval change in size of a large intraparenchymal hematoma within the right posterior temporal and parietal lobes with surrounding edema. Bilateral intraventricular hemorrhages are again noted. There is subarachnoid hemorrhage overlying the right intraparenchymal hematoma which does not appear significantly changed. A small amount of left parietal subarachnoid hemorrhage is also unchanged.  2. The ventricles are enlarged but stable in size compared  to prior CT. No significant midline shift.  3. Atrophy and periventricular small vessel ischemic changes.   Ct Head Wo Contrast 08/29/2016 1. Large acute hematoma involving the right parietal and temporal lobes as described (estimated volume 34.6 ml). This likely represents a hemorrhagic stroke. There is associated subarachnoid and intraventricular hemorrhage.  2. Mild ventricular dilatation is likely related to atrophy. No midline shift.   Dg Chest Portable 1 View 08/29/2016 Left pacer is in place with leads in the right atrium and right ventricle.  No active disease.   Ct Head Wo Contrast 08/30/2016 IMPRESSION: 1. Expected evolution of large right parietal, temporal, and occipital lobe hemorrhage with extension into the right lateral ventricle. 2. Stable subarachnoid hemorrhage on the right. 3. Stable intraventricular hemorrhage without significant change in ventricular caliber. 4. Stable effacement of the right lateral ventricle and minimal midline shift.    TTE - Mild LVH with LVEF 65-70% and grade 1 diastolic dysfunction.   Calcified mitral annulus with trivial mitral regurgitation.   Sclerotic aortic valve without stenosis. Apparent device wire   present within the right heart.  EEG -  This EEG is abnormal due to moderate diffuse slowing of the background with suppression over the right temporal region. Clinical Correlation of the above findings indicates diffuse cerebral dysfunction that is non-specific in etiology and can be seen  with hypoxic/ischemic injury, toxic/metabolic encephalopathies, neurodegenerative disorders, or medication effect.  Asymmetry with lower voltage activity over the right temporal region indicates focal cerebral dysfunction in this region suggestive of underlying structural abnormality. The absence of epileptiform discharges does not rule out a clinical diagnosis of epilepsy.  Clinical correlation is advised.   PHYSICAL EXAM  Temp:  [98.1 F (36.7 C)-98.9 F  (37.2 C)] 98.1 F (36.7 C) (03/15 1432) Pulse Rate:  [60-78] 78 (03/15 1432) Resp:  [16-20] 16 (03/15 1432) BP: (127-171)/(52-67) 150/62 (03/15 1432) SpO2:  [91 %-100 %] 95 % (03/15 1432)  General - Well nourished, well developed, not in acute distress.  Ophthalmologic - Fundi not visualized due to noncooperation.  Cardiovascular - Regular rate and rhythm.  Neuro - awake, alert, orientated to name only, not orientated to place, age, time or people. Able to repeat simple sentences, not able to name. Able to have simple sentence, but not following commands well. Eyes attending to both sides, no gaze deviation. However, seems to have left hemianopia. No significant facial asymmetry, not follow commands for tongue protrusion. BUE 3/5 and BLE 3-/5 but lack of effort, no lateralizing weakness. BUE postural tremor with cog wheeling. DTR 1+ and no babinski. Sensation, coordination and gait not tested.    ASSESSMENT/PLAN Evelyn Figueroa is a 82 y.o. female with history of dementia, permanent pacemaker, and hypertension presenting with AMS and weakness. She did not receive IV t-PA due to hemorrhage.  Stroke: moderate to large ICH within the right posterior temporal and parietal lobes ~ 34.6 ml, etiology unclear, concerning for CAA. Hypertensive bleed is also possible.  Resultant left hemianopia  MRI / MRA - PPM manufacturer unknown  CT head - right posterior temporal and parietal ICH  Repeat CT head x 2 - stable hematoma  Carotid Doppler - not indicated  EEG no seizure  2D Echo - EF 65-70%  LDL - 89  HgbA1c - 5.6  VTE prophylaxis - SCDs DIET - DYS 1 Room service appropriate? Yes; Fluid consistency: Thin  aspirin 81 mg daily prior to admission, now on No antithrombotic. Due to concern of CAA, recommend no antiplatelet or anticoagulation in the future.   Ongoing aggressive stroke risk factor management  Therapy recommendations: SNF recommended  Disposition: Pending  Cerebral  edema  Mild surrounding edema around hematoma  No significant midline shift  No indication for hypertonic saline at this time  Consider hypertonic saline if needed   Dementia  Memory unit NH resident  Question about CAA as the cause of lobar hemorrhage  MRI not able to be done due to pacer  Daughter confirmed pt in SNF wheelchair bound  Resume aricept  Hypertension  Stable - on Cleviprex  BP goal < 140 due to concerns of CAA  Resume home BP meds - increase enalapril to 20mg  bid, and metoprolol to 25mg  bid  Add amlodipine 10mg   Wean off cleviprex as able  Hyperlipidemia  Home meds:  Lipitor 20 mg daily not resumed in hospital  LDL 89, goal < 70  Avoid statin due to concern of CAA and high risk of bleeding  Other Stroke Risk Factors  Advanced age  Concerning for CAA  Other Active Problems  Mild leukocytosis - 15.1 -> 12.3 -> 12.9->13.7->11.6-> 11.5->10.6  Hypokalemia - 3.3->3.0->3.6-> 3.3 - supplement  Pacemaker in situ  Limited DNR - all other interventions short of cardiopulmonary resuscitation.  Hospital day # 6   Marvel PlanJindong Tymika Grilli, MD PhD Stroke Neurology 09/04/2016 7:44 PM  To contact Stroke Continuity provider, please refer to [email or web address] After hours, contact General Neurology

## 2016-09-04 NOTE — Care Management Important Message (Signed)
Important Message  Patient Details  Name: Evelyn OdorCarol Ercole MRN: [id number] Date of Birth: July 07, 1934   Medicare Important Message Given:  Other (see comment) Patient is unable to sign for her Medicare rights at this time due to her illness   Dorena Bodoris Burnett Lieber 09/04/2016, 3:04 PM

## 2016-09-04 NOTE — Care Management Note (Signed)
Case Management Note  Patient Details  Name: Evelyn Figueroa MRN: [id number] Date of Birth: 08-05-1934  Subjective/Objective:                    Action/Plan: Pt discharging to SNF. No further needs per CM.  Expected Discharge Date:  09/04/16               Expected Discharge Plan:  Skilled Nursing Facility  In-House Referral:  Clinical Social Work  Discharge planning Services     Post Acute Care Choice:    Choice offered to:     DME Arranged:    DME Agency:     HH Arranged:    HH Agency:     Status of Service:  Completed, signed off  If discussed at [employer] Meetings, dates discussed:    Additional Comments:  Kermit BaloKelli F Kathalene Sporer, RN 09/04/2016, 4:19 PM

## 2016-09-04 NOTE — Clinical Social Work Note (Signed)
CSW consulted for new SNF placement. CSW spoke with daughter Medical POA about SNF placement and [facility]. CSW confirmed bed offer with [facility]. CSW assessment to follow. CSW will continue to follow for d/c needs.   Corlis HoveJeneya Issabelle Mcraney, LCSWA, LCASA Clinical Social Work [phone]

## 2016-09-05 DIAGNOSIS — G936 Cerebral edema: Secondary | ICD-10-CM

## 2016-09-05 DIAGNOSIS — I680 Cerebral amyloid angiopathy: Secondary | ICD-10-CM

## 2016-09-05 DIAGNOSIS — E854 Organ-limited amyloidosis: Secondary | ICD-10-CM

## 2016-09-05 LAB — BASIC METABOLIC PANEL
Anion gap: 8 (ref 5–15)
BUN: 20 mg/dL (ref 6–20)
CALCIUM: 9.2 mg/dL (ref 8.9–10.3)
CO2: 29 mmol/L (ref 22–32)
Chloride: 109 mmol/L (ref 101–111)
Creatinine, Ser: 0.66 mg/dL (ref 0.44–1.00)
GFR calc non Af Amer: >60 mL/min (ref >60)
Glucose, Bld: 120 mg/dL — ABNORMAL HIGH (ref 65–99)
Potassium: 3.9 mmol/L (ref 3.5–5.1)
Sodium: 146 mmol/L — ABNORMAL HIGH (ref 135–145)

## 2016-09-05 LAB — CBC
HEMATOCRIT: 35.4 % — AB (ref 36.0–46.0)
Hemoglobin: 11.8 g/dL — ABNORMAL LOW (ref 12.0–15.0)
MCH: 30.3 pg (ref 26.0–34.0)
MCHC: 33.3 g/dL (ref 30.0–36.0)
MCV: 90.8 fL (ref 78.0–100.0)
Platelets: 283 10*3/uL (ref 150–400)
RBC: 3.9 MIL/uL (ref 3.87–5.11)
RDW: 14 % (ref 11.5–15.5)
WBC: 8.6 10*3/uL (ref 4.0–10.5)

## 2016-09-05 MED ORDER — ENSURE ENLIVE PO LIQD: 237.0000 mL | Freq: Two times a day (BID) | ORAL | 12 refills | Status: AC

## 2016-09-05 NOTE — Clinical Social Work Placement (Signed)
   CLINICAL SOCIAL WORK PLACEMENT  NOTE  Date:  09/05/2016  Patient Details  Name: Evelyn Figueroa MRN: [id number] Date of Birth: 12-Nov-1934  Clinical Social Work is seeking post-discharge placement for this patient at the Skilled  Nursing Facility level of care (*CSW will initial, date and re-position this form in  chart as items are completed):  Yes   Patient/family provided with [place] Clinical Social Work Department's list of facilities offering this level of care within the geographic area requested by the patient (or if unable, by the patient's family).  Yes   Patient/family informed of their freedom to choose among providers that offer the needed level of care, that participate in Medicare, Medicaid or managed care program needed by the patient, have an available bed and are willing to accept the patient.  Yes   Patient/family informed of 's ownership interest in [facility] and [facility], as well as of the fact that they are under no obligation to receive care at these facilities.  PASRR submitted to EDS on 09/04/16     PASRR number received on 09/04/16     Existing PASRR number confirmed on       FL2 transmitted to all facilities in geographic area requested by pt/family on 09/04/16     FL2 transmitted to all facilities within larger geographic area on       Patient informed that his/her managed care company has contracts with or will negotiate with certain facilities, including the following:        Yes   Patient/family informed of bed offers received.  [facility] [place]     Physician recommends and patient chooses bed at      [facility] [place] on 09/05/16.  Patient to be transferred to facility by PTAR     Patient family notified on 09/05/16 of transfer.  Name of family member notified:  Carma LairLinda Birman     PHYSICIAN Please sign FL2     Additional Comment:  Pt is ready  for discharge today and will [facility]. Pt and daughter are aware and agreeable to discharge plan. CS[facility] clinical[facility] and communicated with Essie HartDeborah Myers (admissions) for room and report. CSW provided Rn with room and report and put in treatment team sticky note. Transportation arranged with PTAR. CSW is signing off as no further needs identified.  _______________________________________________ Dominic PeaJeneya G Aubriella Perezgarcia, LCSW 09/05/2016, 5:53 PM

## 2016-09-05 NOTE — Progress Notes (Signed)
  Speech Language Pathology Treatment: Dysphagia  Patient Details Name: Evelyn Figueroa MRN: [id number] DOB: Jul 21, 1934 Today's Date: 09/05/2016 Time: [id number] SLP Time Calculation (min) (ACUTE ONLY): 10 min  Assessment / Plan / Recommendation Clinical Impression  Pt continues to require full assistance with feeding and careful attention to bolus size.  Cup sips of thin liquid elicit immediate cough, eliminated with tspn sized boluses (per MBS, cough is c/w aspiration).  Restrictions to spoon may be difficult to carry-out consistently at SNF, so liquids can be supplemented with nectars, which pt can drink from straw/cup with fewer concerns for aspiration.  Required moderate cueing to open eyes, receive bolus, and attend throughout session.  Pt for likely D/C to SNF today.  Will require SLP f/u at facility for both cognition and dysphagia.     HPI HPI: Pt is a 82 y.o. female with h/o dementia and HTN. Pt presenting with L sided weakness and lethargy. CT on 3/9 showed a large intraparenchymal hematoma within the R posterior temporal and parietal lobes      SLP Plan  Discharge SLP treatment due to (comment) (D/C to SNF)       Recommendations  Diet recommendations: Dysphagia 1 (puree);Thin liquid Liquids provided via: Teaspoon Medication Administration: Crushed with puree Supervision: Full supervision/cueing for compensatory strategies;Trained caregiver to feed patient Compensations: Slow rate;Small sips/bites;Other (Comment) Postural Changes and/or Swallow Maneuvers: Seated upright 90 degrees                Oral Care Recommendations: Staff/trained caregiver to provide oral care;Oral care BID Follow up Recommendations: Skilled Nursing facility SLP Visit Diagnosis: Dysphagia, unspecified (R13.10) Plan: Discharge SLP treatment due to (comment) (D/C to SNF)       GO                Evelyn Figueroa, Evelyn Figueroa Evelyn Figueroa 09/05/2016, 10:35 AM

## 2016-09-05 NOTE — Progress Notes (Signed)
Pt transported via [facility].  Sondra ComeSilva, Lehman Whiteley M, RN

## 2016-09-05 NOTE — Progress Notes (Signed)
Report called to Hospital doctorJen RN at [facility].  Awaiting PTAR for transport.  Will continue to monitor.   Sondra ComeSilva, Camaya Gannett M, RN

## 2016-09-05 NOTE — Progress Notes (Signed)
Physical Therapy Treatment Patient Details Name: Evelyn OdorCarol Figueroa MRN: [id number] DOB: Aug 31, 1934 Today's Date: 09/05/2016    History of Present Illness Pt is a 82 y.o. female with h/o dementia and HTN. Pt presenting with L sided weakness and lethargy. CT on 3/9 showed a large intraparenchymal hematoma within the R posterior temporal and parietal lobes.    PT Comments    Pt continues to be fearful during mobility and with limited participation in session. Focus of session was truncal stability training. Pt was able to lean onto R and L elbow but required hands-on assist to maintain. Pt asking to brush teeth and therapist assisted in cleaning teeth off with mouth swab. Will continue to follow.    Follow Up Recommendations  SNF;Supervision/Assistance - 24 hour     Equipment Recommendations  None recommended by PT    Recommendations for Other Services       Precautions / Restrictions Precautions Precautions: Fall Restrictions Weight Bearing Restrictions: No    Mobility  Bed Mobility Overal bed mobility: Needs Assistance Bed Mobility: Rolling Rolling: Max assist   Supine to sit: Max assist;+2 for physical assistance Sit to supine: Max assist;+2 for physical assistance   General bed mobility comments: Max assist to roll for repositioning. Pt with tight grasp on rails with any mobility. Bed pad used for assist in transferring to/from sitting and to scoot up in bed.   Transfers                 General transfer comment: Not assessed this session.  Ambulation/Gait                 Stairs            Wheelchair Mobility    Modified Rankin (Stroke Patients Only) Modified Rankin (Stroke Patients Only) Pre-Morbid Rankin Score: Moderately severe disability Modified Rankin: Severe disability     Balance Overall balance assessment: Needs assistance Sitting-balance support: Feet supported;Bilateral upper extremity supported Sitting balance-Leahy Scale:  Poor Sitting balance - Comments: Posterior lean in sitting, resistive to anterior lean even with cues and encouragement. Attempted ant/post sway and lateral sway in rhythmic movement. Postural control: Posterior lean                          Cognition Arousal/Alertness: Lethargic Behavior During Therapy: Flat affect Overall Cognitive Status: Impaired/Different from baseline Area of Impairment: Attention;Following commands;Problem solving   Current Attention Level: Sustained   Following Commands: Follows one step commands inconsistently;Follows one step commands with increased time     Problem Solving: Slow processing;Decreased initiation;Requires verbal cues General Comments: Hx of dementia. Pt responding to approximately 25 % of commands this session with multimodal cues.    Exercises      General Comments        Pertinent Vitals/Pain Pain Assessment: Faces Faces Pain Scale: No hurt    Home Living                      Prior Function            PT Goals (current goals can now be found in the care plan section) Acute Rehab PT Goals Patient Stated Goal: none stated PT Goal Formulation: Patient unable to participate in goal setting Time For Goal Achievement: 09/13/16 Potential to Achieve Goals: Poor Progress towards PT goals: Not progressing toward goals - comment (secondary to fear, resistance to movement)    Frequency    Min 2X/week  PT Plan Current plan remains appropriate    Co-evaluation             End of Session Equipment Utilized During Treatment: Oxygen Activity Tolerance: Patient limited by fatigue;Patient limited by lethargy;Other (comment) (fear) Patient left: in bed;with call bell/phone within reach;with bed alarm set;with SCD's reapplied Nurse Communication: Mobility status PT Visit Diagnosis: Other symptoms and signs involving the nervous system (O53.664)     Time: 4034-7425 PT Time Calculation (min) (ACUTE  ONLY): 23 min  Charges:  $Therapeutic Activity: 23-37 mins                    G Codes:       Marylynn Pearson 09-10-2016, 3:24 PM  Conni Slipper, PT, DPT Acute Rehabilitation Services Pager: [phone]

## 2016-09-05 NOTE — Clinical Social Work Note (Signed)
Clinical Social Work Assessment  Patient Details  Name: Evelyn Figueroa MRN: [id number] Date of Birth: 01-10-1935  Date of referral:  09/05/16               Reason for consult:  Discharge Planning                Permission sought to share information with:  Family Supports Permission granted to share information::  Yes, Verbal Permission Granted  Name::     Harland Dingwall  Agency::  SNFs  Relationship::  Daughter  Contact Information:  [phone]  Housing/Transportation Living arrangements for the past 2 months:  [place] of Information:  Adult Children (Daughter) Patient Interpreter Needed:  None Criminal Activity/Legal Involvement Pertinent to Current Situation/Hospitalization:  No - Comment as needed Significant Relationships:  Adult Children Lives with:  Facility Resident Nanine Means) Do you feel safe going back to the place where you live?  Yes Need for family participation in patient care:  Yes (Comment)  Care giving concerns:  No caregiving concerns identified.    Social Worker assessment / plan:  CSW met with pt yesterday to address consult for new SNF. Pt oriented to self only. CSW introduced herself and explained role of social work. P/T is recommending STR at SNF. Pt was unable to answer most questions.   CSW spoke with daughter-Linda Birman (POA) via phone yesterday. CSW explained P/T recommendations. Dtr agreeable to SNF for STR for pt. Daughter prefers [facility] for placement due to pt's son building a house there. CSW will send FL-2 to SNFs and followed up with potential bed offers. Daughter chose [facility], who made and confirmed bed offer. Daughter lives in [place] so facility paperwork to be faxed to daughter.  CSW will continue to follow.   Employment status:  Retired Forensic scientist:  Medicare PT Recommendations:  [place] / Referral to community resources:  [place]  Patient/Family's Response to care:  Daughter appreciative and thankful for CSW support.  Patient/Family's Understanding of and Emotional Response to Diagnosis, Current Treatment, and Prognosis:  Daughter understands pt will benefit from STR prior to returning to ALF or LTC.   Emotional Assessment Appearance:  Appears stated age Attitude/Demeanor/Rapport:   (Appropriate) Affect (typically observed):  Pleasant Orientation:  Oriented to Self Alcohol / Substance use:  Other Psych involvement (Current and /or in the community):  No (Comment)  Discharge Needs  Concerns to be addressed:  Care Coordination Readmission within the last 30 days:  No Current discharge risk:  Cognitively Impaired Barriers to Discharge:  Continued Medical Work up   Truitt Merle, LCSW 09/05/2016, 5:45 PM

## 2017-05-23 DEATH — deceased

## 2017-12-23 IMAGING — CT CT HEAD W/O CM
3 of 4 series · 16 of 47 positions shown, 19 images · non-contrast
Comparison: CT head without contrast 08/29/2016.

CLINICAL DATA: Follow-up intracranial hemorrhage.

EXAM:
CT HEAD WITHOUT CONTRAST
TECHNIQUE: Contiguous axial images were obtained from the base of the skull
through the vertex without intravenous contrast.

[Series 201: head w/o, idose (1) · axial · non-contrast · 0.42mm/px · z∈[+366,+511]mm · 10 of 35 slices shown, 13 images]
[im 3/35  brain]
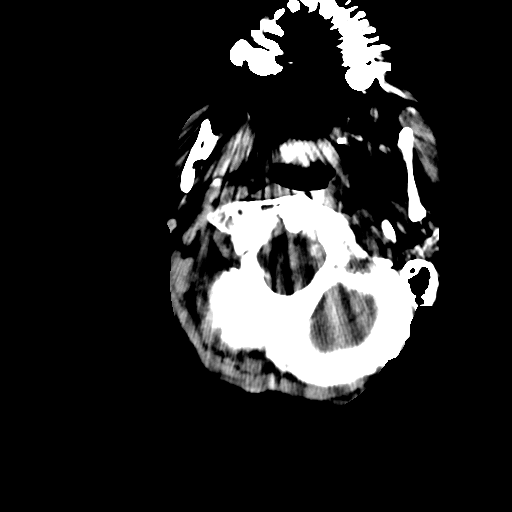
[im 3/35  bone]
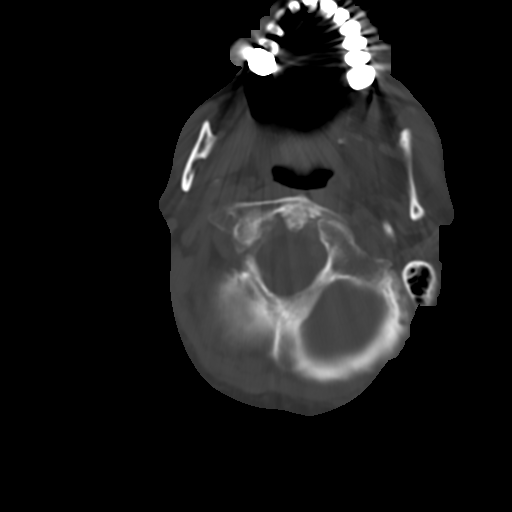
[im 5/35  brain]
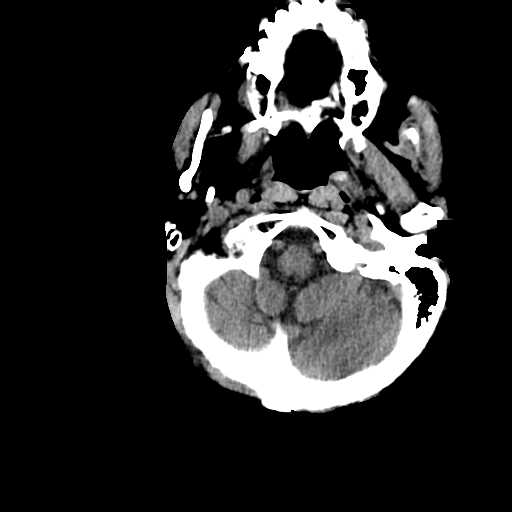
[im 10/35  brain]
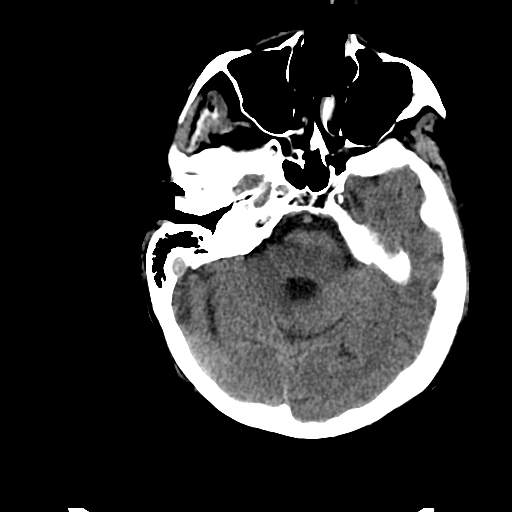
[im 13/35  brain]
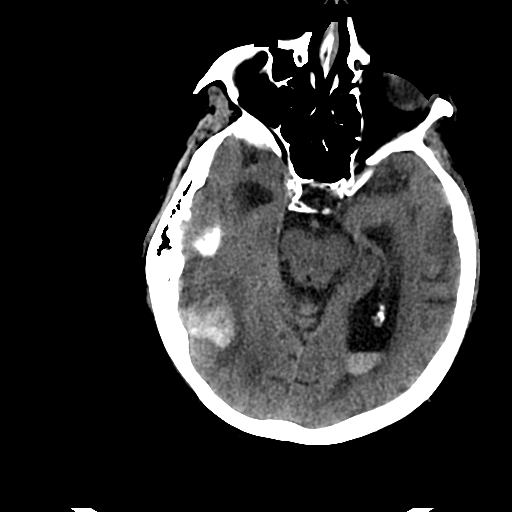
[im 15/35  brain]
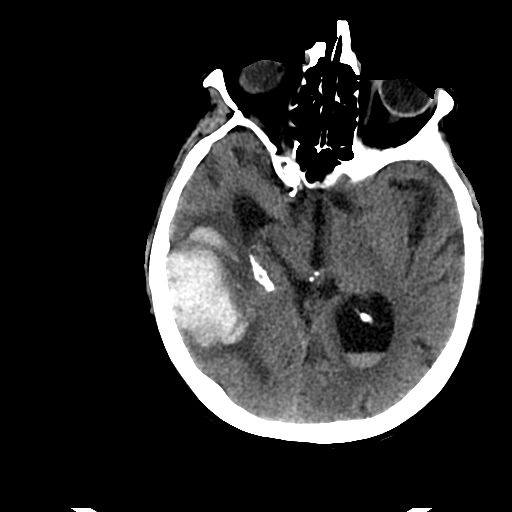
[im 15/35  bone]
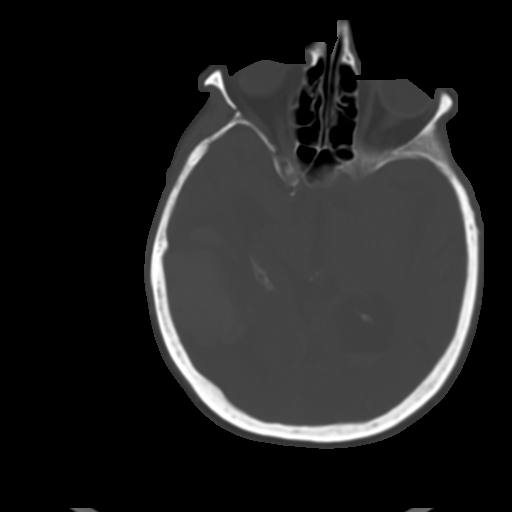
[im 20/35  brain]
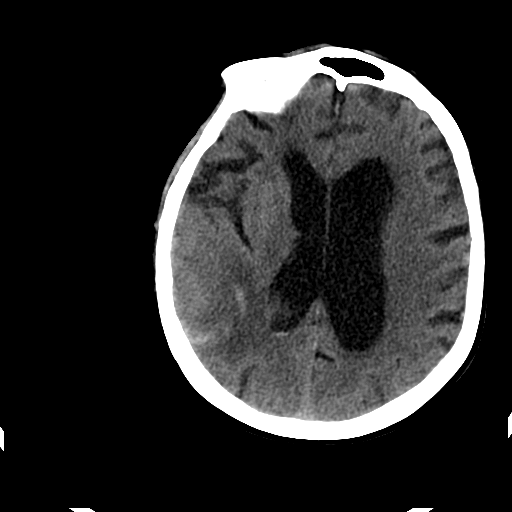
[im 22/35  brain]
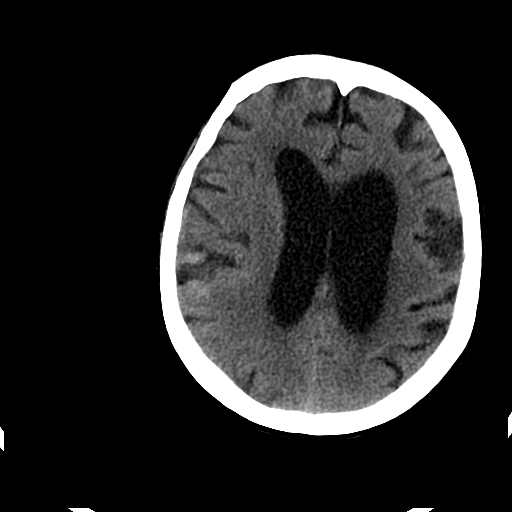
[im 25/35  brain]
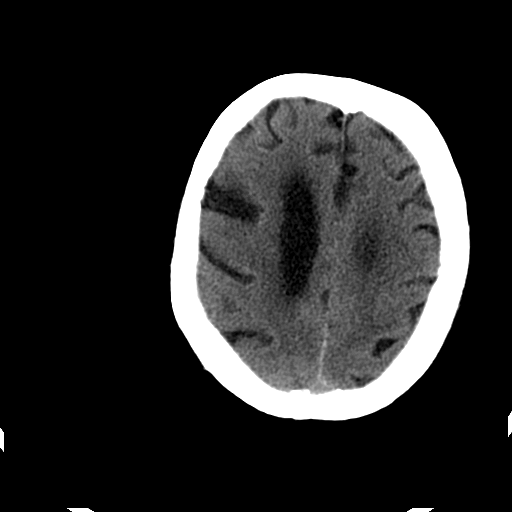
[im 30/35  brain]
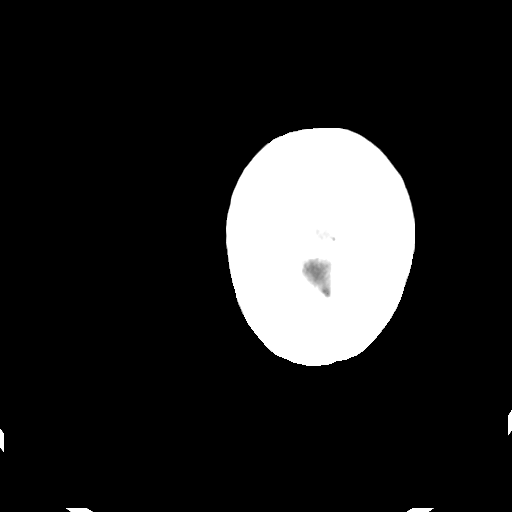
[im 30/35  bone]
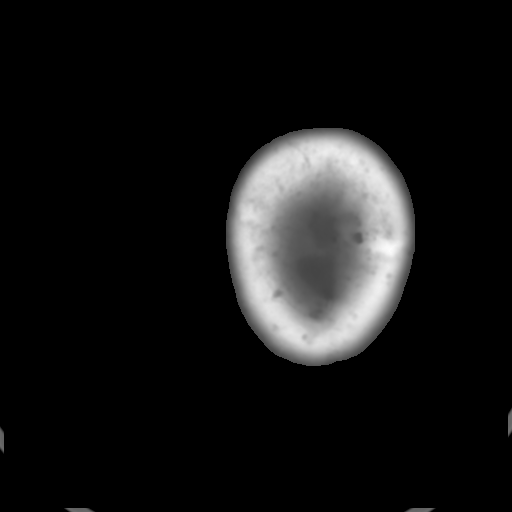
[im 32/35  brain]
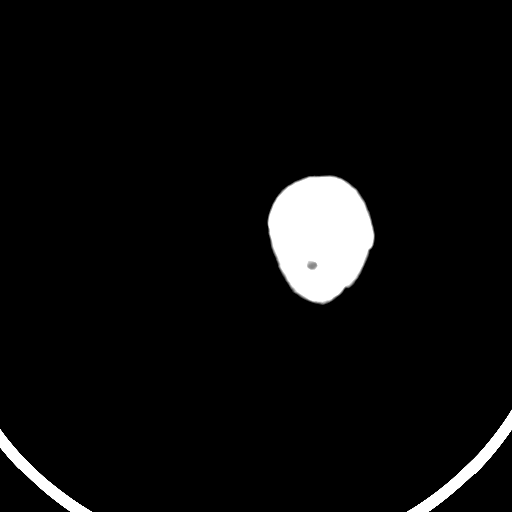

[Series 203: coronal st, idose (1) · coronal · 0.40mm/px · 3 of 72 slices shown]
[im 24/72  brain]
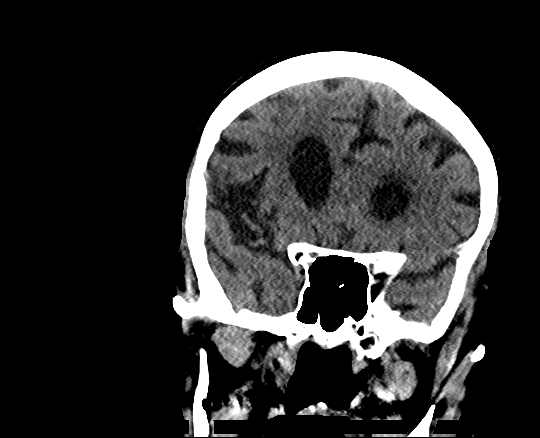
[im 32/72  brain]
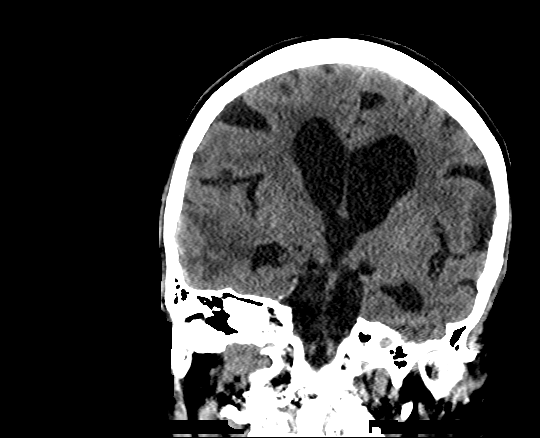
[im 40/72  brain]
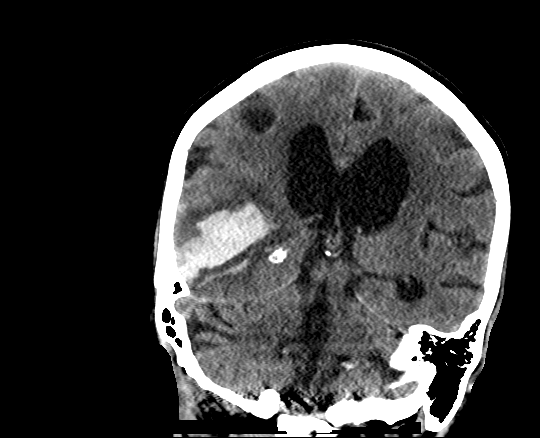

[Series 204: sagittal st, idose (1) · sagittal · 0.40mm/px · 3 of 70 slices shown]
[im 24/70  brain]
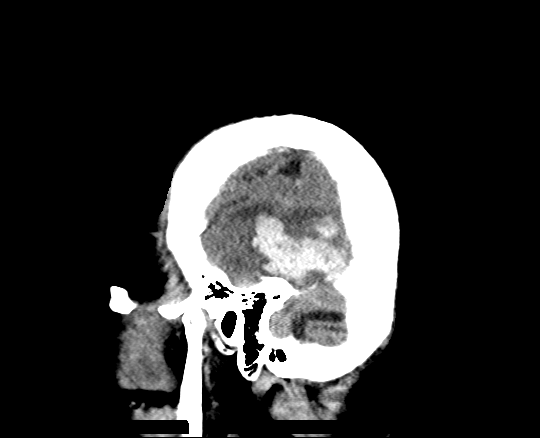
[im 35/70  brain]
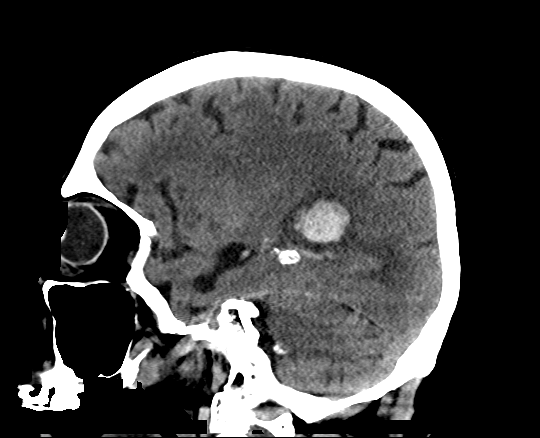
[im 47/70  brain]
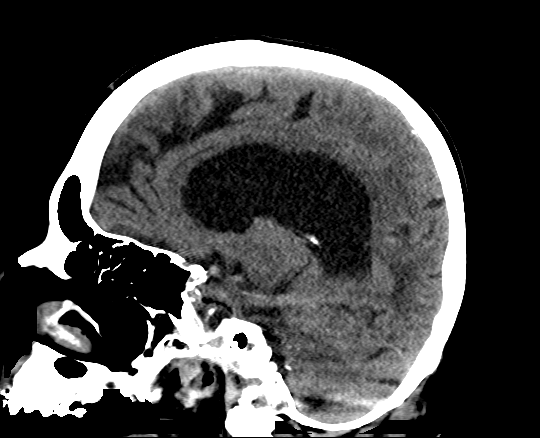

[16 of 47 positions shown; findings below may reference images not displayed]

FINDINGS: Brain: A large parenchymal hemorrhage involving the right parietal,
temporal, and occipital lobe is stable in size measuring 5.7 x 4.9 x
2.1 cm. There is extension into the right lateral ventricle. There
is expected evolution of edema surrounding the hemorrhage. Extensive
subarachnoid hemorrhage is stable. Minimal hemorrhage
previously-seen scratched minimal left subarachnoid hemorrhage
previously-seen is no longer evident. Layering blood is again noted
within the left lateral ventricle. There is significant effacement
of the right lateral ventricle. Midline shift measures 4 mm
maximally. Not significantly changed from the prior exam.

No new areas of hemorrhage are present. The third and fourth
ventricles are within normal limits. The brainstem and cerebellum
are normal.

Vascular: Atherosclerotic calcifications are present within the
cavernous internal carotid arteries bilaterally. There is no
hyperdense vessel.

Skull: The calvarium is intact. No focal lytic or blastic lesions
are present.

Sinuses/Orbits: The globes and orbits are normal. The paranasal
sinuses and mastoid air cells are clear.
IMPRESSION: 1. Expected evolution of large right parietal, temporal, and
occipital lobe hemorrhage with extension into the right lateral
ventricle.
2. Stable subarachnoid hemorrhage on the right.
3. Stable intraventricular hemorrhage without significant change in
ventricular caliber.
4. Stable effacement of the right lateral ventricle and minimal
midline shift.

## 2017-12-26 IMAGING — DX DG CHEST 1V PORT
1 series · 1 of 1 positions shown · non-contrast
Comparison: 08/29/2016

CLINICAL DATA: Leukocytosis

EXAM:
PORTABLE CHEST 1 VIEW

[chest ap]
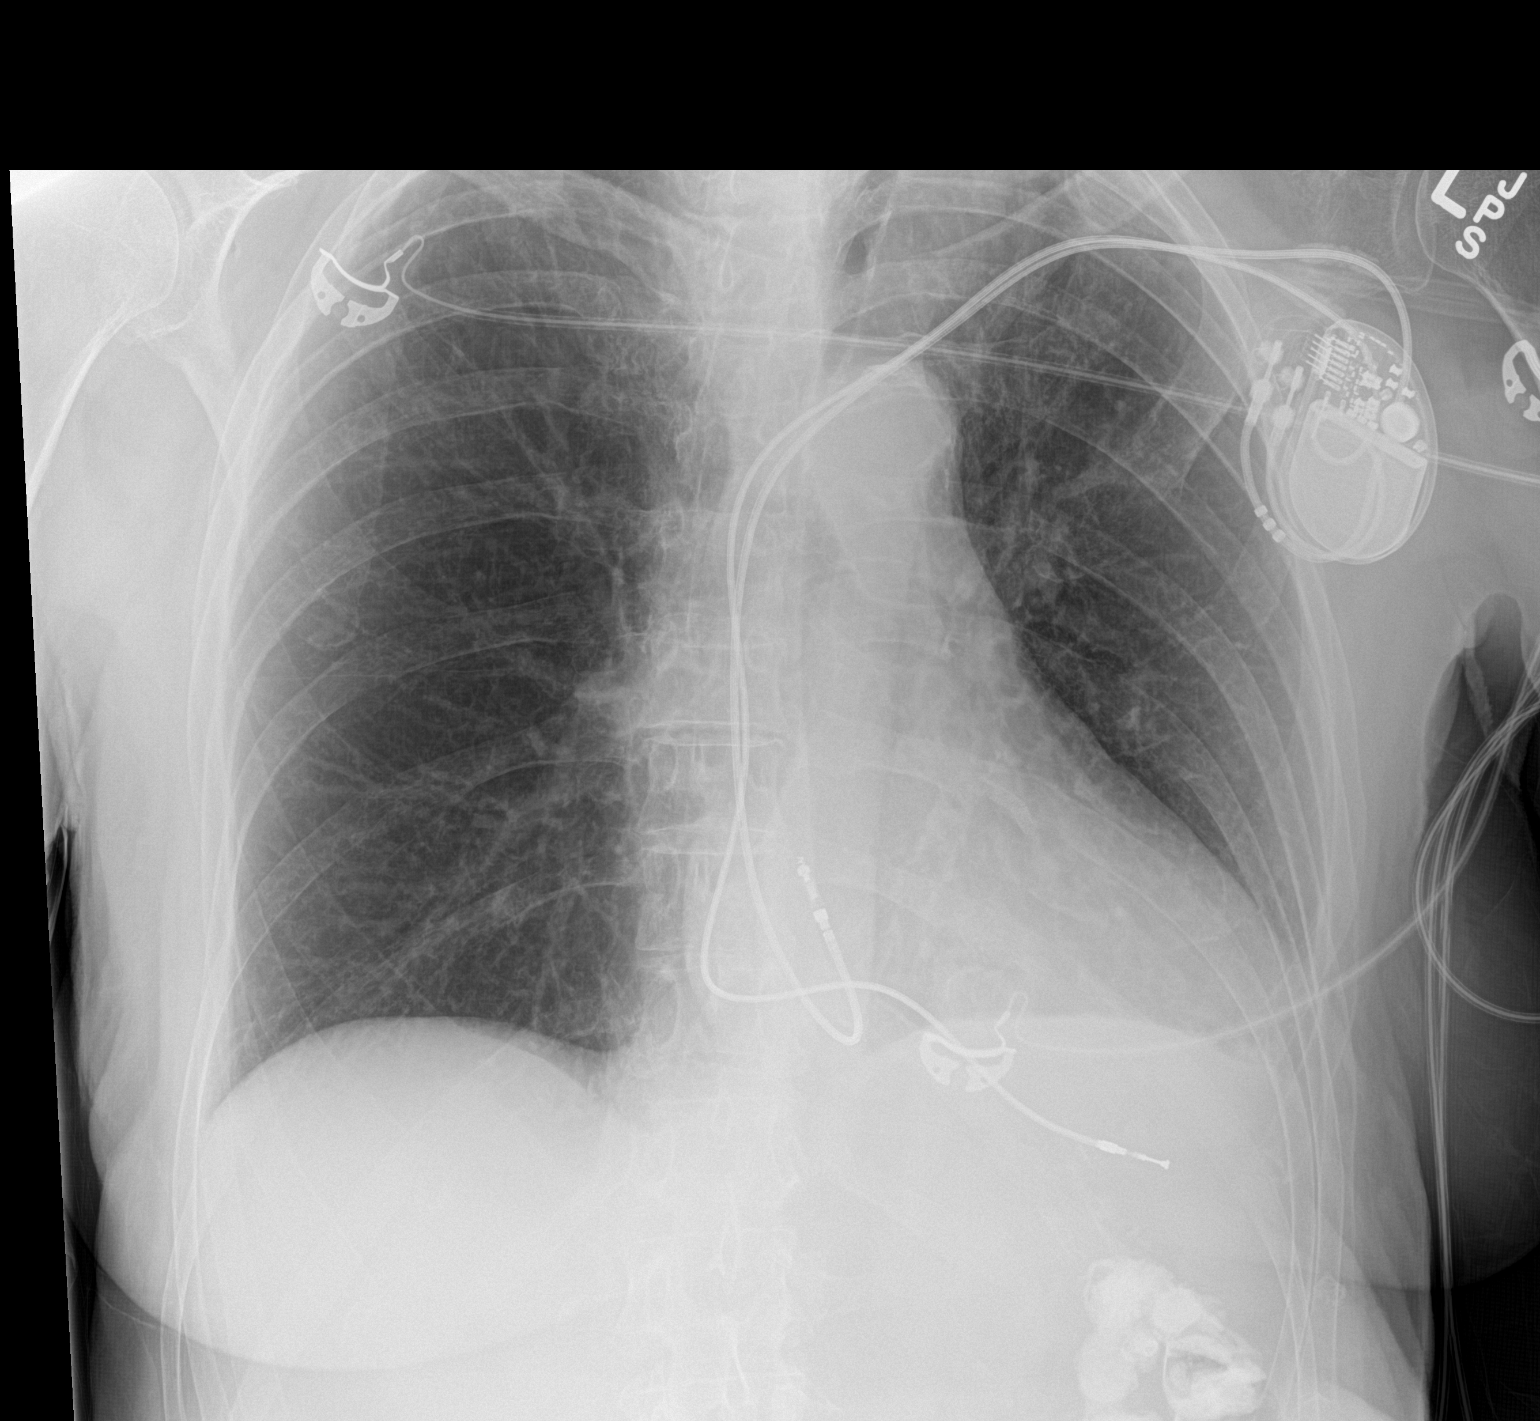

[1 of 1 positions shown; findings below may reference images not displayed]

FINDINGS: Left pacer remains in place, unchanged. Heart is borderline in size.
Lungs are clear. No effusions or acute bony abnormality.
IMPRESSION: No active disease.
